# Patient Record
Sex: Female | Born: 1975
Health system: Southern US, Community
[De-identification: ages and names within clinical notes are randomized; demographics above are authoritative.]

## PROBLEM LIST (undated history)

## (undated) DIAGNOSIS — N926 Irregular menstruation, unspecified: Secondary | ICD-10-CM

## (undated) DIAGNOSIS — D649 Anemia, unspecified: Secondary | ICD-10-CM

## (undated) DIAGNOSIS — T7840XA Allergy, unspecified, initial encounter: Secondary | ICD-10-CM

## (undated) DIAGNOSIS — Z87440 Personal history of urinary (tract) infections: Secondary | ICD-10-CM

## (undated) DIAGNOSIS — N63 Unspecified lump in unspecified breast: Secondary | ICD-10-CM

## (undated) DIAGNOSIS — I10 Essential (primary) hypertension: Secondary | ICD-10-CM

## (undated) DIAGNOSIS — N939 Abnormal uterine and vaginal bleeding, unspecified: Secondary | ICD-10-CM

## (undated) HISTORY — DX: Anemia, unspecified: D64.9

## (undated) HISTORY — PX: ABDOMINAL HYSTERECTOMY: SHX81

## (undated) HISTORY — DX: Allergy, unspecified, initial encounter: T78.40XA

## (undated) HISTORY — PX: DILATION AND CURETTAGE OF UTERUS: SHX78

## (undated) HISTORY — DX: Personal history of urinary (tract) infections: Z87.440

## (undated) HISTORY — DX: Essential (primary) hypertension: I10

---

## 2007-07-12 ENCOUNTER — Emergency Department: Payer: Self-pay | Admitting: Emergency Medicine

## 2007-07-14 ENCOUNTER — Emergency Department: Payer: Self-pay | Admitting: Emergency Medicine

## 2010-07-29 ENCOUNTER — Ambulatory Visit: Payer: Self-pay | Admitting: Emergency Medicine

## 2013-08-16 ENCOUNTER — Ambulatory Visit: Payer: Self-pay | Admitting: Obstetrics and Gynecology

## 2013-08-16 LAB — CBC
HCT: 24.9 % — ABNORMAL LOW (ref 35.0–47.0)
HGB: 8.1 g/dL — ABNORMAL LOW (ref 12.0–16.0)
MCH: 28.2 pg (ref 26.0–34.0)
MCHC: 32.8 g/dL (ref 32.0–36.0)
MCV: 86 fL (ref 80–100)
Platelet: 449 10*3/uL — ABNORMAL HIGH (ref 150–440)
RBC: 2.88 10*6/uL — ABNORMAL LOW (ref 3.80–5.20)
RDW: 17.4 % — ABNORMAL HIGH (ref 11.5–14.5)

## 2013-08-16 LAB — BASIC METABOLIC PANEL
BUN: 12 mg/dL (ref 7–18)
Chloride: 107 mmol/L (ref 98–107)
Co2: 29 mmol/L (ref 21–32)
Creatinine: 0.71 mg/dL (ref 0.60–1.30)
Osmolality: 276 (ref 275–301)
Potassium: 4 mmol/L (ref 3.5–5.1)
Sodium: 139 mmol/L (ref 136–145)

## 2013-08-16 LAB — URINALYSIS, COMPLETE
Ketone: NEGATIVE
Ph: 7 (ref 4.5–8.0)
RBC,UR: 28 /HPF (ref 0–5)
Squamous Epithelial: 1

## 2013-08-20 ENCOUNTER — Ambulatory Visit: Payer: Self-pay | Admitting: Obstetrics and Gynecology

## 2014-12-23 ENCOUNTER — Ambulatory Visit: Payer: Self-pay | Admitting: Obstetrics and Gynecology

## 2015-03-21 NOTE — Op Note (Signed)
PATIENT NAME:  Natalie Fisher, Natalie Fisher MR#:  [id number] DATE OF BIRTH:  [birth year]  DATE OF PROCEDURE:  08/20/2013  PREOPERATIVE DIAGNOSES: Polymenorrhea and menorrhagia.   POSTOPERATIVE DIAGNOSIS: Polymenorrhea and menorrhagia.   PROCEDURE: Dilatation and curettage hysteroscopy.   SURGEON: Ricky L. Mitzy Naron.  ANESTHESIA: General orotracheal.  FINDINGS: Morbidly obese female, easily dilated cervix with hysteroscopic findings showing fluffy endometrium without evidence of distinct anatomical structures.   ESTIMATED BLOOD LOSS: Minimal.   COMPLICATIONS: None.   SPECIMENS: Endometrial curetting.   DRAINS: Red rubber at the end of the case, possibly 100 mL.   PROCEDURE IN DETAIL: The patient was consented. Preoperative antibiotics were given. The patient was taken to the operating room and placed in the supine position where anesthesia was initiated and she was then prepped and draped in the usual sterile fashion after being placed in the dorsal lithotomy position. The cervix was visualized and grasped with a single-tooth tenaculum, easily dilated _____ hysteroscope with findings as noted above. Sharp curettage was carried out with a moderate amount of curettings returned, which were sent for permanent section. Instruments removed. Cervix was made hemostatic with silver nitrate. The patient tolerated the procedure well and was returned to supine position.   I anticipate a routine postoperative course.    ____________________________ Rockey Situ. Amalia Hailey, MD rle:sg D: 08/20/2013 14:07:32 ET T: 08/20/2013 14:34:41 ET JOB#: [id number]  cc: Ricky L. Amalia Hailey, MD, <Dictator> Selmer Dominion MD ELECTRONICALLY SIGNED 08/21/2013 10:48

## 2016-11-04 ENCOUNTER — Other Ambulatory Visit: Payer: Self-pay | Admitting: Obstetrics and Gynecology

## 2016-11-04 DIAGNOSIS — N6310 Unspecified lump in the right breast, unspecified quadrant: Secondary | ICD-10-CM

## 2016-11-04 DIAGNOSIS — N6320 Unspecified lump in the left breast, unspecified quadrant: Principal | ICD-10-CM

## 2016-11-11 ENCOUNTER — Other Ambulatory Visit: Payer: Self-pay | Admitting: Obstetrics and Gynecology

## 2016-11-11 DIAGNOSIS — N63 Unspecified lump in unspecified breast: Secondary | ICD-10-CM

## 2016-12-23 ENCOUNTER — Ambulatory Visit
Admission: RE | Admit: 2016-12-23 | Discharge: 2016-12-23 | Disposition: A | Discharge status: Home / Self Care | Payer: 59 | Source: Ambulatory Visit | Attending: Obstetrics and Gynecology | Admitting: Obstetrics and Gynecology

## 2016-12-23 DIAGNOSIS — N6310 Unspecified lump in the right breast, unspecified quadrant: Secondary | ICD-10-CM | POA: Diagnosis not present

## 2016-12-23 DIAGNOSIS — N63 Unspecified lump in unspecified breast: Secondary | ICD-10-CM

## 2016-12-23 DIAGNOSIS — N6489 Other specified disorders of breast: Secondary | ICD-10-CM | POA: Diagnosis not present

## 2016-12-23 DIAGNOSIS — R928 Other abnormal and inconclusive findings on diagnostic imaging of breast: Secondary | ICD-10-CM | POA: Diagnosis not present

## 2016-12-23 DIAGNOSIS — N6320 Unspecified lump in the left breast, unspecified quadrant: Secondary | ICD-10-CM | POA: Diagnosis not present

## 2016-12-23 HISTORY — DX: Unspecified lump in unspecified breast: N63.0

## 2017-06-21 DIAGNOSIS — N921 Excessive and frequent menstruation with irregular cycle: Secondary | ICD-10-CM | POA: Diagnosis not present

## 2017-07-04 DIAGNOSIS — N920 Excessive and frequent menstruation with regular cycle: Secondary | ICD-10-CM | POA: Diagnosis not present

## 2017-07-04 DIAGNOSIS — N921 Excessive and frequent menstruation with irregular cycle: Secondary | ICD-10-CM | POA: Diagnosis not present

## 2017-07-19 DIAGNOSIS — N920 Excessive and frequent menstruation with regular cycle: Secondary | ICD-10-CM | POA: Diagnosis not present

## 2017-07-19 DIAGNOSIS — R600 Localized edema: Secondary | ICD-10-CM | POA: Diagnosis not present

## 2017-07-19 DIAGNOSIS — I10 Essential (primary) hypertension: Secondary | ICD-10-CM | POA: Diagnosis not present

## 2017-08-25 DIAGNOSIS — Z862 Personal history of diseases of the blood and blood-forming organs and certain disorders involving the immune mechanism: Secondary | ICD-10-CM | POA: Diagnosis not present

## 2017-08-25 DIAGNOSIS — N920 Excessive and frequent menstruation with regular cycle: Secondary | ICD-10-CM | POA: Diagnosis not present

## 2017-08-31 NOTE — H&P (Signed)
Patient ID: Natalie Fisher is a 41 y.o. female presenting with Pre Op Consulting  on 08/25/2017  HPI: She is bleeding, menometrorrhagia and significant and sx anemia. Irregular periods since age 74. G0, but considering childbearing and doesn't really want kids.  TSH wnl H/H 8.1/28.5 and elevated ptls 607, likely due to her anemia  She has had a workup in the past. Prior studies: ultrasound and labs .  Ultrasound: abnormal  Fibroids: fundal fibroid 6.89cm   Previous treatment(s): hormonal including hormonal contraceptives. She had a D&C with Dr. Amalia Hailey in 2015  Health care maintenance:      Last Pap smear:  12/17- neg with  Neg HPV  Contraception: OCP (estrogen/progesterone)  EMBx: collected today  HTN: poorly controlled Consider blood transfusion prior to surgery/iron infusions  Past Medical History:  has a past medical history of Depression, unspecified; Fibroid; HSV-2 infection; Hypertension; IDA (iron deficiency anemia); and Vitamin B12 deficiency.  Past Surgical History:  has a past surgical history that includes Dilation and curettage, diagnostic / therapeutic. Family History: family history includes Colon cancer in her other; Diabetes in her father; Heart disease in her father, maternal grandmother, paternal grandmother, and paternal uncle; High blood pressure (Hypertension) in her father and mother; Ovarian cancer in her paternal grandmother. Social History:  reports that she quit smoking about 15 months ago. Her smoking use included Cigarettes. She smoked 0.00 packs per day. She has never used smokeless tobacco. She reports that she drinks alcohol. She reports that she does not use drugs. OB/GYN History:          OB History    Gravida Para Term Preterm AB Living   0 0 0 0 0 0   SAB TAB Ectopic Molar Multiple Live Births   0 0 0   0        Allergies: has No Known Allergies. Medications:  Current Outpatient Prescriptions:  .  amLODIPine  (NORVASC) 5 MG tablet, Take 1 tablet (5 mg total) by mouth once daily., Disp: 30 tablet, Rfl: 2 .  cyanocobalamin (VITAMIN B12) 1,000 mcg/mL injection, Inject 1,000 mcg into the muscle monthly., Disp: , Rfl:  .  ferrous sulfate 325 (65 FE) MG tablet, Take 325 mg by mouth daily with breakfast., Disp: , Rfl:  .  hydroCHLOROthiazide (HYDRODIURIL) 25 MG tablet, Take 1 tablet (25 mg total) by mouth once daily., Disp: 30 tablet, Rfl: 2 .  norethindrone-ethinyl estradiol (OVCON-35) 0.4-35 mg-mcg tablet, Take 1 tablet by mouth once daily., Disp: 3 Package, Rfl: 3   Review of Systems: No SOB, no palpitations or chest pain, no new lower extremity edema, no nausea or vomiting or bowel or bladder complaints. See HPI for gyn specific ROS.   Exam:   BP (!) 142/94   Ht 166.4 cm (5' 5.5")   Wt (!) 148.7 kg (327 lb 12.8 oz)   LMP 07/31/2017 (Approximate)   BMI 53.72 kg/m   General: Patient is well-groomed, well-nourished, appears stated age in no acute distress  HEENT: head is atraumatic and normocephalic, trachea is midline, neck is supple with no palpable nodules  CV: Regular rhythm and normal heart rate, no murmur  Pulm: Clear to auscultation throughout lung fields with no wheezing, crackles, or rhonchi. No increased work of breathing  Abdomen: soft , no mass, non-tender, no rebound tenderness, no hepatomegaly  Pelvic: tanner stage 5 ,              External genitalia: vulva /labia no lesions  Urethra: no prolapse             Vagina: normal physiologic d/c, laxity in vaginal walls             Cervix: no lesions, no cervical motion tenderness, no descent             Uterus: normal size shape and contour, non-tender             Adnexa: no mass,  non-tender               Rectovaginal: External wnl   Impression:   The primary encounter diagnosis was Excessive or frequent menstruation. A diagnosis of History of anemia was also pertinent to this visit.    Plan:     Patient returns for a preoperative discussion regarding her plans to proceed with surgical treatment of her AUB by supracervical laparoscopic hysterectomy with bilateral salpingectomy  procedure. We will perform a cystoscopy to evaluate the urinary tract after the procedure.   The patient and I discussed the technical aspects of the procedure including the potential for risks and complications. These include but are not limited to the risk of infection requiring post-operative antibiotics or further procedures. We talked about the risk of injury to adjacent organs including bladder, bowel, ureter, blood vessels or nerves. We talked about the need to convert to an open incision. We talked about the possible need for blood transfusion. We talked aboutpostop complications such asthromboembolic or cardiopulmonary complications. All of her questions were answered.  Her preoperative exam was completed and the appropriate consents were signed. She is scheduled to undergo this procedure in the near future.  Specific Peri-operative Considerations:  - Consent: obtained today  - Labs: CBC, CMP preoperatively - Studies: EKG, CXR preoperatively - Bowel Preparation: None required - Abx:  Cefoxitin 3g - VTE ppx: SCDs perioperatively - Glucose Protocol: -type 1 diabetes   - Hx of Jehovah's Witness, but accepts blood to save life. - Hx of Type 1 diabetes with pump - Desires to keep cervix: Plan for eXcite technique with 49mm bag and Endoscope closure - Hx of anemia: Recommend Iron infusions if needed - f/u on embx taken today - obesity: BMI 54

## 2017-09-02 ENCOUNTER — Inpatient Hospital Stay: Payer: 59

## 2017-09-02 ENCOUNTER — Encounter: Payer: Self-pay | Admitting: Oncology

## 2017-09-02 ENCOUNTER — Inpatient Hospital Stay: Payer: 59 | Attending: Oncology | Admitting: Oncology

## 2017-09-02 VITALS — BP 157/85 | HR 84 | Temp 96.6°F | Resp 16 | Ht 66.75 in | Wt 328.5 lb

## 2017-09-02 DIAGNOSIS — D538 Other specified nutritional anemias: Secondary | ICD-10-CM

## 2017-09-02 DIAGNOSIS — D500 Iron deficiency anemia secondary to blood loss (chronic): Secondary | ICD-10-CM | POA: Diagnosis present

## 2017-09-02 DIAGNOSIS — N920 Excessive and frequent menstruation with regular cycle: Secondary | ICD-10-CM | POA: Insufficient documentation

## 2017-09-02 DIAGNOSIS — D649 Anemia, unspecified: Secondary | ICD-10-CM

## 2017-09-02 DIAGNOSIS — E538 Deficiency of other specified B group vitamins: Secondary | ICD-10-CM | POA: Diagnosis not present

## 2017-09-02 DIAGNOSIS — Z87440 Personal history of urinary (tract) infections: Secondary | ICD-10-CM | POA: Diagnosis not present

## 2017-09-02 DIAGNOSIS — I10 Essential (primary) hypertension: Secondary | ICD-10-CM | POA: Insufficient documentation

## 2017-09-02 DIAGNOSIS — N63 Unspecified lump in unspecified breast: Secondary | ICD-10-CM | POA: Diagnosis not present

## 2017-09-02 DIAGNOSIS — Z79899 Other long term (current) drug therapy: Secondary | ICD-10-CM | POA: Insufficient documentation

## 2017-09-02 DIAGNOSIS — F17210 Nicotine dependence, cigarettes, uncomplicated: Secondary | ICD-10-CM | POA: Insufficient documentation

## 2017-09-02 DIAGNOSIS — Z720 Tobacco use: Secondary | ICD-10-CM

## 2017-09-02 LAB — IRON AND TIBC
IRON: 90 ug/dL (ref 28–170)
Saturation Ratios: 19 % (ref 10.4–31.8)
TIBC: 484 ug/dL — ABNORMAL HIGH (ref 250–450)
UIBC: 394 ug/dL

## 2017-09-02 LAB — CBC WITH DIFFERENTIAL/PLATELET
Basophils Absolute: 0.1 10*3/uL (ref 0–0.1)
Basophils Relative: 1 %
Eosinophils Absolute: 0.1 10*3/uL (ref 0–0.7)
Eosinophils Relative: 1 %
HCT: 35 % (ref 35.0–47.0)
Hemoglobin: 11.5 g/dL — ABNORMAL LOW (ref 12.0–16.0)
Lymphocytes Relative: 24 %
Lymphs Abs: 2.4 10*3/uL (ref 1.0–3.6)
MCH: 26.5 pg (ref 26.0–34.0)
MCHC: 32.9 g/dL (ref 32.0–36.0)
MCV: 80.6 fL (ref 80.0–100.0)
Monocytes Absolute: 0.6 10*3/uL (ref 0.2–0.9)
Monocytes Relative: 6 %
Neutro Abs: 6.9 10*3/uL — ABNORMAL HIGH (ref 1.4–6.5)
Neutrophils Relative %: 68 %
Platelets: 425 10*3/uL (ref 150–440)
RBC: 4.34 MIL/uL (ref 3.80–5.20)
RDW: 27.6 % — ABNORMAL HIGH (ref 11.5–14.5)
WBC: 10.1 10*3/uL (ref 3.6–11.0)

## 2017-09-02 LAB — COMPREHENSIVE METABOLIC PANEL
ALBUMIN: 3.5 g/dL (ref 3.5–5.0)
ALT: 11 U/L — ABNORMAL LOW (ref 14–54)
AST: 15 U/L (ref 15–41)
Alkaline Phosphatase: 73 U/L (ref 38–126)
Anion gap: 7 (ref 5–15)
BILIRUBIN TOTAL: 0.3 mg/dL (ref 0.3–1.2)
BUN: 11 mg/dL (ref 6–20)
CO2: 24 mmol/L (ref 22–32)
Calcium: 9.2 mg/dL (ref 8.9–10.3)
Chloride: 105 mmol/L (ref 101–111)
Creatinine, Ser: 0.55 mg/dL (ref 0.44–1.00)
GFR calc Af Amer: >60 mL/min (ref >60)
GLUCOSE: 74 mg/dL (ref 65–99)
POTASSIUM: 4 mmol/L (ref 3.5–5.1)
SODIUM: 136 mmol/L (ref 135–145)
Total Protein: 7.6 g/dL (ref 6.5–8.1)

## 2017-09-02 LAB — FOLATE: FOLATE: 59.2 ng/mL (ref >5.9)

## 2017-09-02 LAB — VITAMIN B12: Vitamin B-12: 271 pg/mL (ref 180–914)

## 2017-09-02 LAB — FERRITIN: FERRITIN: 14 ng/mL (ref 11–307)

## 2017-09-02 NOTE — Progress Notes (Signed)
Patient here today as a new patient for anemia.  Patient is scheduled for hysterectomy next week

## 2017-09-02 NOTE — Progress Notes (Signed)
Hematology/Oncology Consult note [facility] [phone] [phone]  CONSULT NOTE Patient Care Team: Patient, No Pcp Per as PCP - General (General Practice)  REFERRING PROVIDER: [place]:  Evaluation and management of anemia, optimizing anemia before surgery.   HISTORY OF PRESENTING ILLNESS:   41 y.o.  female with PMH listed below who was referred by Lonn Georgia to me for evaluation of anemia. Patient reports history iron deficiency anemia due to heavy menstrual period/blood loss. Records from [facility] was reviewed.   She has Gyn work up and found to have fundal fibroid. Previously she has had hormonal treatment including contraceptives. Had D&C in 2015. Last pap smear was 10/2016, HPV was negative. She also has a history of B12 deficiency.  Patient has been taking oral iron supplementation and B12 supplementation. She is single,does not have children. She decides to pursue hysterotomy which is schedule next week and her GYN referred her to me for evaluation of anemia and possible IV iron treatment. She is Fara Boros witness and does not want to blood transfusion.  Other medical history include morbid obesity and hypertension. She feels tired and craves for ice chips.   ROS:  Review of Systems  Constitutional: Positive for fatigue.  HENT:  Negative.   Eyes: Negative.   Respiratory: Positive for shortness of breath.   Cardiovascular: Negative.   Gastrointestinal: Negative.   Endocrine: Negative.   Genitourinary: Negative.    Musculoskeletal: Negative.   Neurological: Negative.   Hematological: Negative.   Psychiatric/Behavioral: Negative.     MEDICAL HISTORY:  Past Medical History:  Diagnosis Date  . Allergy   . Anemia   . Breast mass    Left Breast 9 oclock / Right Breast 3oclock  . History of bladder infections   . Hypertension     SURGICAL  HISTORY: History reviewed. No pertinent surgical history.  SOCIAL HISTORY: Social History   Social History  . Marital status: Single    Spouse name: N/A  . Number of children: N/A  . Years of education: N/A   Occupational History  . Not on file.   Social History Main Topics  . Smoking status: Light Tobacco Smoker    Packs/day: 0.25    Years: 20.00    Types: Cigars  . Smokeless tobacco: Never Used  . Alcohol use Yes     Comment: twice mthly  . Drug use: No  . Sexual activity: Yes   Other Topics Concern  . Not on file   Social History Narrative  . No narrative on file    FAMILY HISTORY: Family History  Problem Relation Age of Onset  . Anemia Mother   . Diabetes Father   . Heart disease Father   . Stroke Father   . Heart disease Maternal Grandmother   . Ovarian cancer Paternal Grandmother   . Diabetes Paternal Grandmother   . Heart disease Paternal Grandmother   . Breast cancer Neg Hx     ALLERGIES:  has No Known Allergies.  MEDICATIONS:  Current Outpatient Prescriptions  Medication Sig Dispense Refill  . amLODipine (NORVASC) 5 MG tablet Take 5 mg by mouth daily.    . Cyanocobalamin (VITAMIN B-12) 500 MCG LOZG Take 500 mcg by mouth daily.    . ferrous sulfate 325 (65 FE) MG tablet Take 325 mg by mouth 2 (two) times daily with a meal.    . hydrochlorothiazide (HYDRODIURIL) 25 MG tablet Take 25 mg by mouth daily.    Marland Kitchen  norethindrone-ethinyl estradiol (BALZIVA) 0.4-35 MG-MCG tablet Take 1 tablet by mouth daily.     No current facility-administered medications for this visit.       Marland Kitchen  PHYSICAL EXAMINATION: ECOG PERFORMANCE STATUS: 1 - Symptomatic but completely ambulatory Vitals:   09/02/17 1039  BP: (!) 157/85  Pulse: 84  Resp: 16  Temp: (!) 96.6 F (35.9 C)   Filed Weights   09/02/17 1039  Weight: (!) 328 lb 8 oz (149 kg)    GENERAL:Alert, no distress and comfortable.  EYES: no pallor or icterus OROPHARYNX: no thrush or ulceration;  NECK:  supple, no masses felt LYMPH:  no palpable lymphadenopathy in the cervical, axillary or inguinal regions LUNGS: clear to auscultation and  No wheeze or crackles HEART/CVS: regular rate & rhythm and no murmurs; No lower extremity edema ABDOMEN: abdomen soft, non-tender and normal bowel sounds Musculoskeletal:no cyanosis of digits and no clubbing  PSYCH: alert & oriented x 3  NEURO: no focal motor/sensory deficits SKIN:  no rashes or significant lesions  LABORATORY DATA:  I have reviewed the data as listed Recent labs from [place] was reviewed.   No results for input(s): NA, K, CL, CO2, GLUCOSE, BUN, CREATININE, CALCIUM, GFRNONAA, GFRAA, PROT, ALBUMIN, AST, ALT, ALKPHOS, BILITOT, BILIDIR, IBILI in the last 8760 hours.  RADIOGRAPHIC STUDIES: I have personally reviewed the radiological images as listed and agreed with the findings in the report.   ASSESSMENT & PLAN:  1. Normocytic anemia   2. Iron deficiency anemia due to chronic blood loss   3. Tobacco abuse    Will check cbc, cmp, iron/tibc and ferritin. Check b12 and folate.  If her iron store is deplete, plan IV FeraHeme. Plan IV iron with FeraHeme 510mg  x 2 doses. Allergy reactions/infusion reaction including anaphylactic reaction discussed with patient. Patient voices understanding and willing to proceed. Smoke cessation discussed with patient. Information of smoke cessation program was provided to patient.   All questions were answered. The patient knows to call the clinic with any problems questions or concerns. Return of visit: 09/29/2017 with cbc, ferritin, iron TIBC done 1 day prior to visit Thank you for this kind referral and the opportunity to participate in the care of this patient. A copy of today's note is routed to referring provider Dr.Beasley.    Earlie Server, MD, PhD Hematology Oncology [facility] Pager- [phone] 09/02/2017

## 2017-09-05 ENCOUNTER — Encounter
Admission: RE | Admit: 2017-09-05 | Discharge: 2017-09-05 | Disposition: A | Discharge status: Home / Self Care | Payer: 59 | Source: Ambulatory Visit | Attending: Obstetrics and Gynecology | Admitting: Obstetrics and Gynecology

## 2017-09-05 HISTORY — DX: Abnormal uterine and vaginal bleeding, unspecified: N93.9

## 2017-09-05 HISTORY — DX: Irregular menstruation, unspecified: N92.6

## 2017-09-05 NOTE — Patient Instructions (Signed)
Your procedure is scheduled on: 09/12/17 Mon Report to Same Day Surgery 2nd floor medical mall [facility] Entrance-take elevator on left to 2nd floor.  Check in with surgery information desk.) To find out your arrival time please call [phone] between 1PM - 3PM on 09/09/17 Fri  Remember: Instructions that are not followed completely may result in serious medical risk, up to and including death, or upon the discretion of your surgeon and anesthesiologist your surgery may need to be rescheduled.    _x___ 1. Do not eat food after midnight the night before your procedure. You may drink clear liquids up to 2 hours before you are scheduled to arrive at the hospital for your procedure.  Do not drink clear liquids within 2 hours of your scheduled arrival to the hospital.  Clear liquids include  --Water or Apple juice without pulp  --Clear carbohydrate beverage such as ClearFast or Gatorade  --Black Coffee or Clear Tea (No milk, no creamers, do not add anything to                  the coffee or Tea Type 1 and type 2 diabetics should only drink water.  No gum chewing or hard candies.     __x__ 2. No Alcohol for 24 hours before or after surgery.   __x__3. No Smoking for 24 prior to surgery.   ____  4. Bring all medications with you on the day of surgery if instructed.    __x__ 5. Notify your doctor if there is any change in your medical condition     (cold, fever, infections).     Do not wear jewelry, make-up, hairpins, clips or nail polish.  Do not wear lotions, powders, or perfumes. You may wear deodorant.  Do not shave 48 hours prior to surgery. Men may shave face and neck.  Do not bring valuables to the hospital.    [facility] is not responsible for any belongings or valuables.               Contacts, dentures or bridgework may not be worn into surgery.  Leave your suitcase in the car. After surgery it may be brought to your room.  For patients admitted to the hospital, discharge  time is determined by your                       treatment team.   Patients discharged the day of surgery will not be allowed to drive home.  You will need someone to drive you home and stay with you the night of your procedure.    Please read over the following fact sheets that you were given:   [facility] Preparing for Surgery and or MRSA Information   _x___ Take anti-hypertensive listed below, cardiac, seizure, asthma,     anti-reflux and psychiatric medicines. These include:  1. amLODipine (NORVASC) 5 MG tablet  2.  3.  4.  5.  6.  ____Fleets enema or Magnesium Citrate as directed.   _x___ Use CHG Soap or sage wipes as directed on instruction sheet   ____ Use inhalers on the day of surgery and bring to hospital day of surgery  ____ Stop Metformin and Janumet 2 days prior to surgery.    ____ Take 1/2 of usual insulin dose the night before surgery and none on the morning     surgery.   _x___ Follow recommendations from Cardiologist, Pulmonologist or PCP regarding  stopping Aspirin, Coumadin, Plavix ,Eliquis, Effient, or Pradaxa, and Pletal.  X____Stop Anti-inflammatories such as Advil, Aleve, Ibuprofen, Motrin, Naproxen, Naprosyn, Goodies powders or aspirin products. OK to take Tylenol and                          Celebrex.   _x___ Stop supplements until after surgery.  But may continue Vitamin D, Vitamin B,       and multivitamin.   ____ Bring C-Pap to the hospital.

## 2017-09-07 ENCOUNTER — Ambulatory Visit
Admission: RE | Admit: 2017-09-07 | Discharge: 2017-09-07 | Disposition: A | Discharge status: Home / Self Care | Payer: 59 | Source: Ambulatory Visit | Attending: Obstetrics and Gynecology | Admitting: Obstetrics and Gynecology

## 2017-09-07 ENCOUNTER — Inpatient Hospital Stay: Payer: 59

## 2017-09-07 VITALS — BP 154/89 | HR 84 | Resp 18

## 2017-09-07 DIAGNOSIS — D649 Anemia, unspecified: Secondary | ICD-10-CM | POA: Diagnosis not present

## 2017-09-07 DIAGNOSIS — D500 Iron deficiency anemia secondary to blood loss (chronic): Secondary | ICD-10-CM

## 2017-09-07 DIAGNOSIS — Z01812 Encounter for preprocedural laboratory examination: Secondary | ICD-10-CM | POA: Insufficient documentation

## 2017-09-07 DIAGNOSIS — Z01810 Encounter for preprocedural cardiovascular examination: Secondary | ICD-10-CM | POA: Insufficient documentation

## 2017-09-07 DIAGNOSIS — E669 Obesity, unspecified: Secondary | ICD-10-CM | POA: Insufficient documentation

## 2017-09-07 LAB — TYPE AND SCREEN
ABO/RH(D): O POS
Antibody Screen: NEGATIVE

## 2017-09-07 LAB — PROTIME-INR
INR: 0.97
PROTHROMBIN TIME: 12.8 s (ref 11.4–15.2)

## 2017-09-07 MED ORDER — SODIUM CHLORIDE 0.9 % IV SOLN
510.0000 mg | Freq: Once | INTRAVENOUS | Status: AC
  Administered 2017-09-07: 510 mg via INTRAVENOUS
  Filled 2017-09-07: qty 17

## 2017-09-07 MED ORDER — SODIUM CHLORIDE 0.9 % IV SOLN
Freq: Once | INTRAVENOUS | Status: AC
  Administered 2017-09-07: 14:00:00 via INTRAVENOUS
  Filled 2017-09-07: qty 1000

## 2017-09-12 ENCOUNTER — Ambulatory Visit
Admission: RE | Admit: 2017-09-12 | Discharge: 2017-09-12 | Disposition: A | Discharge status: Home / Self Care | Payer: Commercial Managed Care - HMO | Source: Ambulatory Visit | Attending: Obstetrics and Gynecology | Admitting: Obstetrics and Gynecology

## 2017-09-12 ENCOUNTER — Encounter
Admission: RE | Disposition: A | Discharge status: Home / Self Care | Payer: Self-pay | Source: Ambulatory Visit | Attending: Obstetrics and Gynecology

## 2017-09-12 ENCOUNTER — Encounter: Payer: Self-pay | Admitting: Emergency Medicine

## 2017-09-12 ENCOUNTER — Ambulatory Visit: Payer: Commercial Managed Care - HMO | Admitting: Certified Registered"

## 2017-09-12 DIAGNOSIS — Z6841 Body Mass Index (BMI) 40.0 and over, adult: Secondary | ICD-10-CM | POA: Diagnosis not present

## 2017-09-12 DIAGNOSIS — Z87891 Personal history of nicotine dependence: Secondary | ICD-10-CM | POA: Insufficient documentation

## 2017-09-12 DIAGNOSIS — F329 Major depressive disorder, single episode, unspecified: Secondary | ICD-10-CM | POA: Insufficient documentation

## 2017-09-12 DIAGNOSIS — N921 Excessive and frequent menstruation with irregular cycle: Secondary | ICD-10-CM | POA: Diagnosis not present

## 2017-09-12 DIAGNOSIS — I10 Essential (primary) hypertension: Secondary | ICD-10-CM | POA: Diagnosis not present

## 2017-09-12 DIAGNOSIS — N920 Excessive and frequent menstruation with regular cycle: Secondary | ICD-10-CM | POA: Insufficient documentation

## 2017-09-12 DIAGNOSIS — Z79899 Other long term (current) drug therapy: Secondary | ICD-10-CM | POA: Insufficient documentation

## 2017-09-12 DIAGNOSIS — D649 Anemia, unspecified: Secondary | ICD-10-CM | POA: Diagnosis not present

## 2017-09-12 DIAGNOSIS — D509 Iron deficiency anemia, unspecified: Secondary | ICD-10-CM | POA: Diagnosis not present

## 2017-09-12 DIAGNOSIS — D259 Leiomyoma of uterus, unspecified: Secondary | ICD-10-CM | POA: Insufficient documentation

## 2017-09-12 HISTORY — PX: LAPAROSCOPIC SUPRACERVICAL HYSTERECTOMY: SHX5399

## 2017-09-12 HISTORY — PX: LAPAROSCOPIC BILATERAL SALPINGECTOMY: SHX5889

## 2017-09-12 LAB — POCT PREGNANCY, URINE: PREG TEST UR: NEGATIVE

## 2017-09-12 LAB — ABO/RH: ABO/RH(D): O POS

## 2017-09-12 SURGERY — HYSTERECTOMY, SUPRACERVICAL, LAPAROSCOPIC
Anesthesia: General

## 2017-09-12 MED ORDER — GABAPENTIN 300 MG PO CAPS
900.0000 mg | ORAL_CAPSULE | ORAL | Status: AC
  Administered 2017-09-12: 900 mg via ORAL

## 2017-09-12 MED ORDER — IODINE STRONG (LUGOLS) 5 % PO SOLN
ORAL | Status: AC
  Filled 2017-09-12: qty 1

## 2017-09-12 MED ORDER — ACETAMINOPHEN 500 MG PO TABS
1000.0000 mg | ORAL_TABLET | ORAL | Status: AC
  Administered 2017-09-12: 1000 mg via ORAL

## 2017-09-12 MED ORDER — ONDANSETRON HCL 4 MG/2ML IJ SOLN
INTRAMUSCULAR | Status: AC
  Filled 2017-09-12: qty 2

## 2017-09-12 MED ORDER — ACETAMINOPHEN 500 MG PO TABS
ORAL_TABLET | ORAL | Status: AC
  Filled 2017-09-12: qty 2

## 2017-09-12 MED ORDER — SUCCINYLCHOLINE CHLORIDE 20 MG/ML IJ SOLN
INTRAMUSCULAR | Status: DC | PRN
  Administered 2017-09-12: 100 mg via INTRAVENOUS

## 2017-09-12 MED ORDER — CELECOXIB 200 MG PO CAPS
400.0000 mg | ORAL_CAPSULE | ORAL | Status: AC
  Administered 2017-09-12: 400 mg via ORAL

## 2017-09-12 MED ORDER — ONDANSETRON HCL 4 MG/2ML IJ SOLN
INTRAMUSCULAR | Status: DC | PRN
  Administered 2017-09-12 (×2): 4 mg via INTRAVENOUS

## 2017-09-12 MED ORDER — ESMOLOL HCL 100 MG/10ML IV SOLN
INTRAVENOUS | Status: AC
  Filled 2017-09-12: qty 10

## 2017-09-12 MED ORDER — ROCURONIUM BROMIDE 100 MG/10ML IV SOLN
INTRAVENOUS | Status: DC | PRN
  Administered 2017-09-12: 10 mg via INTRAVENOUS
  Administered 2017-09-12: 40 mg via INTRAVENOUS
  Administered 2017-09-12: 20 mg via INTRAVENOUS

## 2017-09-12 MED ORDER — FENTANYL CITRATE (PF) 100 MCG/2ML IJ SOLN
INTRAMUSCULAR | Status: DC | PRN
  Administered 2017-09-12 (×5): 50 ug via INTRAVENOUS

## 2017-09-12 MED ORDER — OXYCODONE HCL 5 MG PO CAPS: 5.0000 mg | ORAL_CAPSULE | Freq: Four times a day (QID) | ORAL | 0 refills | Status: DC | PRN

## 2017-09-12 MED ORDER — ONDANSETRON HCL 4 MG/2ML IJ SOLN
4.0000 mg | Freq: Once | INTRAMUSCULAR | Status: AC | PRN
  Administered 2017-09-12: 4 mg via INTRAVENOUS

## 2017-09-12 MED ORDER — LIDOCAINE HCL (CARDIAC) 20 MG/ML IV SOLN
INTRAVENOUS | Status: DC | PRN
  Administered 2017-09-12: 40 mg via INTRAVENOUS

## 2017-09-12 MED ORDER — PROPOFOL 10 MG/ML IV BOLUS
INTRAVENOUS | Status: DC | PRN
Start: 2017-09-12 — End: 2017-09-12
  Administered 2017-09-12: 200 mg via INTRAVENOUS

## 2017-09-12 MED ORDER — CEFAZOLIN SODIUM-DEXTROSE 2-4 GM/100ML-% IV SOLN
INTRAVENOUS | Status: AC
  Filled 2017-09-12: qty 100

## 2017-09-12 MED ORDER — DEXAMETHASONE SODIUM PHOSPHATE 10 MG/ML IJ SOLN
INTRAMUSCULAR | Status: DC | PRN
  Administered 2017-09-12: 10 mg via INTRAVENOUS

## 2017-09-12 MED ORDER — FAMOTIDINE 20 MG PO TABS
ORAL_TABLET | ORAL | Status: AC
  Administered 2017-09-12: 20 mg via ORAL
  Filled 2017-09-12: qty 1

## 2017-09-12 MED ORDER — FENTANYL CITRATE (PF) 100 MCG/2ML IJ SOLN
25.0000 ug | INTRAMUSCULAR | Status: DC | PRN
  Administered 2017-09-12 (×3): 25 ug via INTRAVENOUS

## 2017-09-12 MED ORDER — LABETALOL HCL 5 MG/ML IV SOLN
INTRAVENOUS | Status: DC | PRN
  Administered 2017-09-12 (×2): 5 mg via INTRAVENOUS

## 2017-09-12 MED ORDER — LACTATED RINGERS IV SOLN
INTRAVENOUS | Status: DC
  Administered 2017-09-12 (×2): via INTRAVENOUS

## 2017-09-12 MED ORDER — ESMOLOL HCL 100 MG/10ML IV SOLN
INTRAVENOUS | Status: DC | PRN
  Administered 2017-09-12: 30 mg via INTRAVENOUS

## 2017-09-12 MED ORDER — BUPIVACAINE HCL 0.5 % IJ SOLN
INTRAMUSCULAR | Status: DC | PRN
  Administered 2017-09-12: 16 mL

## 2017-09-12 MED ORDER — MIDAZOLAM HCL 2 MG/2ML IJ SOLN
INTRAMUSCULAR | Status: AC
  Filled 2017-09-12: qty 2

## 2017-09-12 MED ORDER — MIDAZOLAM HCL 2 MG/2ML IJ SOLN
INTRAMUSCULAR | Status: DC | PRN
  Administered 2017-09-12: 2 mg via INTRAVENOUS

## 2017-09-12 MED ORDER — ACETAMINOPHEN 500 MG PO TABS: 1000.0000 mg | ORAL_TABLET | Freq: Four times a day (QID) | ORAL | 0 refills | Status: AC

## 2017-09-12 MED ORDER — PROPOFOL 10 MG/ML IV BOLUS
INTRAVENOUS | Status: AC
  Filled 2017-09-12: qty 20

## 2017-09-12 MED ORDER — PHENYLEPHRINE HCL 10 MG/ML IJ SOLN
INTRAMUSCULAR | Status: DC | PRN
  Administered 2017-09-12 (×3): 100 ug via INTRAVENOUS
  Administered 2017-09-12: 50 ug via INTRAVENOUS
  Administered 2017-09-12 (×2): 100 ug via INTRAVENOUS
  Administered 2017-09-12 (×2): 50 ug via INTRAVENOUS

## 2017-09-12 MED ORDER — METHYLENE BLUE 0.5 % INJ SOLN
INTRAVENOUS | Status: DC | PRN
  Administered 2017-09-12: 1.5 mL

## 2017-09-12 MED ORDER — IODINE STRONG (LUGOLS) 5 % PO SOLN
ORAL | Status: DC | PRN
  Administered 2017-09-12: 5 mL

## 2017-09-12 MED ORDER — IBUPROFEN 800 MG PO TABS: 800.0000 mg | ORAL_TABLET | Freq: Three times a day (TID) | ORAL | 1 refills | Status: DC | PRN

## 2017-09-12 MED ORDER — CEFAZOLIN SODIUM-DEXTROSE 2-4 GM/100ML-% IV SOLN
2.0000 g | INTRAVENOUS | Status: AC
  Administered 2017-09-12: 3 g via INTRAVENOUS

## 2017-09-12 MED ORDER — CELECOXIB 200 MG PO CAPS
ORAL_CAPSULE | ORAL | Status: AC
  Filled 2017-09-12: qty 2

## 2017-09-12 MED ORDER — FAMOTIDINE 20 MG PO TABS
20.0000 mg | ORAL_TABLET | Freq: Once | ORAL | Status: AC
  Administered 2017-09-12: 20 mg via ORAL

## 2017-09-12 MED ORDER — GABAPENTIN 800 MG PO TABS: 800.0000 mg | ORAL_TABLET | Freq: Every day | ORAL | 0 refills | Status: AC

## 2017-09-12 MED ORDER — SILVER NITRATE-POT NITRATE 75-25 % EX MISC
CUTANEOUS | Status: AC
  Filled 2017-09-12: qty 1

## 2017-09-12 MED ORDER — FENTANYL CITRATE (PF) 250 MCG/5ML IJ SOLN
INTRAMUSCULAR | Status: AC
  Filled 2017-09-12: qty 5

## 2017-09-12 MED ORDER — DOCUSATE SODIUM 100 MG PO CAPS: 100.0000 mg | ORAL_CAPSULE | Freq: Two times a day (BID) | ORAL | 0 refills | Status: DC

## 2017-09-12 MED ORDER — BUPIVACAINE HCL (PF) 0.5 % IJ SOLN
INTRAMUSCULAR | Status: AC
  Filled 2017-09-12: qty 30

## 2017-09-12 MED ORDER — CEFAZOLIN SODIUM-DEXTROSE 1-4 GM/50ML-% IV SOLN
INTRAVENOUS | Status: AC
  Filled 2017-09-12: qty 50

## 2017-09-12 MED ORDER — KETOROLAC TROMETHAMINE 30 MG/ML IJ SOLN
INTRAMUSCULAR | Status: DC | PRN
  Administered 2017-09-12: 30 mg via INTRAVENOUS

## 2017-09-12 MED ORDER — LACTATED RINGERS IV SOLN
INTRAVENOUS | Status: DC
  Administered 2017-09-12: 10:00:00 via INTRAVENOUS

## 2017-09-12 MED ORDER — GABAPENTIN 300 MG PO CAPS
ORAL_CAPSULE | ORAL | Status: AC
  Filled 2017-09-12: qty 3

## 2017-09-12 MED ORDER — SUGAMMADEX SODIUM 200 MG/2ML IV SOLN
INTRAVENOUS | Status: DC | PRN
  Administered 2017-09-12: 297.6 mg via INTRAVENOUS

## 2017-09-12 MED ORDER — FENTANYL CITRATE (PF) 100 MCG/2ML IJ SOLN
INTRAMUSCULAR | Status: AC
  Administered 2017-09-12: 25 ug via INTRAVENOUS
  Filled 2017-09-12: qty 2

## 2017-09-12 MED ORDER — METHYLENE BLUE 0.5 % INJ SOLN
INTRAVENOUS | Status: AC
  Filled 2017-09-12: qty 10

## 2017-09-12 SURGICAL SUPPLY — 58 items
BAG URO DRAIN 2000ML W/SPOUT (MISCELLANEOUS) ×3 IMPLANT
BASIN GRAD PLASTIC 32OZ STRL (MISCELLANEOUS) ×3 IMPLANT
BLADE SURG SZ11 CARB STEEL (BLADE) ×3 IMPLANT
CANISTER SUCT 1200ML W/VALVE (MISCELLANEOUS) ×3 IMPLANT
CATH FOLEY 2WAY  5CC 16FR (CATHETERS) ×1
CATH ROBINSON RED A/P 16FR (CATHETERS) ×3 IMPLANT
CATH URTH 16FR FL 2W BLN LF (CATHETERS) ×2 IMPLANT
CHLORAPREP W/TINT 26ML (MISCELLANEOUS) ×3 IMPLANT
DECANTER SPIKE VIAL GLASS SM (MISCELLANEOUS) ×3 IMPLANT
DERMABOND ADVANCED (GAUZE/BANDAGES/DRESSINGS) ×1
DERMABOND ADVANCED .7 DNX12 (GAUZE/BANDAGES/DRESSINGS) ×2 IMPLANT
DRAPE POUCH INSTRU U-SHP 10X18 (DRAPES) IMPLANT
DRSG TEGADERM 2-3/8X2-3/4 SM (GAUZE/BANDAGES/DRESSINGS) ×9 IMPLANT
FILTER LAP SMOKE EVAC STRL (MISCELLANEOUS) ×3 IMPLANT
GAUZE SPONGE NON-WVN 2X2 STRL (MISCELLANEOUS) ×4 IMPLANT
GLOVE BIO SURGEON STRL SZ7 (GLOVE) ×12 IMPLANT
GLOVE INDICATOR 7.5 STRL GRN (GLOVE) ×3 IMPLANT
GOWN STRL REUS W/ TWL LRG LVL3 (GOWN DISPOSABLE) ×4 IMPLANT
GOWN STRL REUS W/ TWL XL LVL3 (GOWN DISPOSABLE) ×2 IMPLANT
GOWN STRL REUS W/TWL LRG LVL3 (GOWN DISPOSABLE) ×2
GOWN STRL REUS W/TWL XL LVL3 (GOWN DISPOSABLE) ×1
IRRIGATION STRYKERFLOW (MISCELLANEOUS) ×2 IMPLANT
IRRIGATOR STRYKERFLOW (MISCELLANEOUS) ×3
IV LACTATED RINGERS 1000ML (IV SOLUTION) ×3 IMPLANT
IV NS 1000ML (IV SOLUTION) ×1
IV NS 1000ML BAXH (IV SOLUTION) ×2 IMPLANT
KIT PINK PAD W/HEAD ARE REST (MISCELLANEOUS) ×3
KIT PINK PAD W/HEAD ARM REST (MISCELLANEOUS) ×2 IMPLANT
KIT RM TURNOVER CYSTO AR (KITS) ×3 IMPLANT
LABEL OR SOLS (LABEL) ×3 IMPLANT
LIGASURE VESSEL 5MM BLUNT TIP (ELECTROSURGICAL) ×3 IMPLANT
MORCELLATOR XCISE  COR (MISCELLANEOUS) ×1
MORCELLATOR XCISE COR (MISCELLANEOUS) ×2 IMPLANT
NS IRRIG 500ML POUR BTL (IV SOLUTION) ×3 IMPLANT
PACK GYN LAPAROSCOPIC (MISCELLANEOUS) ×3 IMPLANT
PAD OB MATERNITY 4.3X12.25 (PERSONAL CARE ITEMS) ×3 IMPLANT
PAD PREP 24X41 OB/GYN DISP (PERSONAL CARE ITEMS) ×3 IMPLANT
POUCH SPECIMEN RETRIEVAL 10MM (ENDOMECHANICALS) IMPLANT
SCISSORS METZENBAUM CVD 33 (INSTRUMENTS) ×3 IMPLANT
SET CYSTO W/LG BORE CLAMP LF (SET/KITS/TRAYS/PACK) ×3 IMPLANT
SHEARS HARMONIC ACE PLUS 36CM (ENDOMECHANICALS) IMPLANT
SLEEVE ENDOPATH XCEL 5M (ENDOMECHANICALS) ×3 IMPLANT
SOLUTION ELECTROLUBE (MISCELLANEOUS) ×3 IMPLANT
SPONGE VERSALON 2X2 STRL (MISCELLANEOUS) ×2
STRIP CLOSURE SKIN 1/2X4 (GAUZE/BANDAGES/DRESSINGS) ×3 IMPLANT
STRIP CLOSURE SKIN 1/4X4 (GAUZE/BANDAGES/DRESSINGS) ×3 IMPLANT
SUT MNCRL AB 4-0 PS2 18 (SUTURE) IMPLANT
SUT VIC AB 0 CT1 36 (SUTURE) ×6 IMPLANT
SUT VIC AB 2-0 UR6 27 (SUTURE) ×3 IMPLANT
SUT VIC AB 4-0 SH 27 (SUTURE) ×2
SUT VIC AB 4-0 SH 27XANBCTRL (SUTURE) ×4 IMPLANT
SWABSTK COMLB BENZOIN TINCTURE (MISCELLANEOUS) ×3 IMPLANT
SYRINGE 10CC LL (SYRINGE) ×3 IMPLANT
TROCAR ENDO BLADELESS 11MM (ENDOMECHANICALS) ×3 IMPLANT
TROCAR XCEL NON-BLD 5MMX100MML (ENDOMECHANICALS) ×3 IMPLANT
TROCAR XCEL UNIV SLVE 11M 100M (ENDOMECHANICALS) ×6 IMPLANT
TUBING INSUF HEATED (TUBING) ×3 IMPLANT
TUBING INSUFFLATOR HI FLOW (MISCELLANEOUS) ×3 IMPLANT

## 2017-09-12 NOTE — Anesthesia Procedure Notes (Addendum)
Anesthesia Procedure Note 18G PIV left hand attempt X2

## 2017-09-12 NOTE — Discharge Instructions (Addendum)
AMBULATORY SURGERY  DISCHARGE INSTRUCTIONS   1) The drugs that you were given will stay in your system until tomorrow so for the next 24 hours you should not:  A) Drive an automobile B) Make any legal decisions C) Drink any alcoholic beverage   2) You may resume regular meals tomorrow.  Today it is better to start with liquids and gradually work up to solid foods.  You may eat anything you prefer, but it is better to start with liquids, then soup and crackers, and gradually work up to solid foods.   3) Please notify your doctor immediately if you have any unusual bleeding, trouble breathing, redness and pain at the surgery site, drainage, fever, or pain not relieved by medication.    4) Additional Instructions:        Please contact your physician with any problems or Same Day Surgery at [phone], Monday through Friday 6 am to 4 pm, or [place] [facility] number at [phone].   Discharge instructions after   supracervical laparoscopic hysterectomy  Signs and Symptoms to Report Call our office at [phone] if you have any of the following.   Fever over 100.4 degrees or higher  Severe stomach pain not relieved with pain medications  Bright red bleeding thats heavier than a period that does not slow with rest  To go the bathroom a lot (frequency), you cant hold your urine (urgency), or it hurts when you empty your bladder (urinate)  Chest pain  Shortness of breath  Pain in the calves of your legs  Severe nausea and vomiting not relieved with anti-nausea medications  Signs of infection around your wounds, such as redness, hot to touch, swelling, green/yellow drainage (like pus), bad smelling discharge  Any concerns  What You Can Expect after Surgery  You may see some pink tinged, bloody fluid and bruising around the wound. This is normal.  You may notice shoulder and neck pain. This is caused by the gas used during surgery to expand  your abdomen so your surgeon could get to the uterus easier.  You may have a sore throat because of the tube in your mouth during general anesthesia. This will go away in 2 to 3 days.  You may have some stomach cramps.  You may notice spotting on your panties.  You may have pain around the incision sites.   Activities after Your Discharge Follow these guidelines to help speed your recovery at home:  Do the coughing and deep breathing as you did in the hospital for 2 weeks. Use the small blue breathing device, called the incentive spirometer for 2 weeks.  Dont drive if you are in pain or taking narcotic pain medicine. You may drive when you can safely slam on the brakes, turn the wheel forcefully, and rotate your torso comfortably. This is typically 1-2 weeks. Practice in a parking lot or side street prior to attempting to drive regularly.   Ask others to help with household chores for 4 weeks.  Do not lift anything heavier that 10 pounds for 4-6 weeks. This includes pets, children, and groceries.  Dont do strenuous activities, exercises, or sports like vacuuming, tennis, squash, etc. until your doctor says it is safe to do so. ---Maintain pelvic rest for 4 weeks. This means nothing in the vagina or rectum at all (no douching, tampons, intercourse) for 4 weeks.   Walk as you feel able. Rest often since it may take two or three weeks for your  energy level to return to normal.   You may climb stairs  Avoid constipation:   -Eat fruits, vegetables, and whole grains. Eat small meals as your appetite will take time to return to normal.   -Drink 6 to 8 glasses of water each day unless your doctor has told you to limit your fluids.   -Use a laxative or stool softener as needed if constipation becomes a problem. You may take Miralax, metamucil, Citrucil, Colace, Senekot, FiberCon, etc. If this does not relieve the constipation, try two tablespoons of Milk Of Magnesia every 8 hours until your  bowels move.   You may shower. Gently wash the wounds with a mild soap and water. Pat dry.  Do not get in a hot tub, swimming pool, etc. for 6 weeks.  Do not use lotions, oils, powders on the wounds.  Do not douche, use tampons, or have sex until your doctor says it is okay.  Take your pain medicine when you need it. The medicine may not work as well if the pain is bad.  Take the medicines you were taking before surgery. Other medications you will need are pain medications and possibly constipation and nausea medications (Zofran).

## 2017-09-12 NOTE — Anesthesia Procedure Notes (Signed)
Procedure Name: Intubation Date/Time: 09/12/2017 11:47 AM Performed by: Timoteo Expose Pre-anesthesia Checklist: Patient identified, Emergency Drugs available, Suction available, Patient being monitored and Timeout performed Patient Re-evaluated:Patient Re-evaluated prior to induction Oxygen Delivery Method: Circle system utilized Preoxygenation: Pre-oxygenation with 100% oxygen Induction Type: IV induction Laryngoscope Size: McGraph and 3 Grade View: Grade I Tube type: Oral Tube size: 7.0 mm Number of attempts: 1 Airway Equipment and Method: Stylet and Patient positioned with wedge pillow Placement Confirmation: ETT inserted through vocal cords under direct vision,  positive ETCO2,  CO2 detector and breath sounds checked- equal and bilateral Secured at: 22 cm Tube secured with: Tape Dental Injury: Teeth and Oropharynx as per pre-operative assessment  Future Recommendations: Recommend- induction with short-acting agent, and alternative techniques readily available

## 2017-09-12 NOTE — Op Note (Addendum)
Natalie Fisher PROCEDURE DATE: 09/12/2017  PREOPERATIVE DIAGNOSIS:  POSTOPERATIVE DIAGNOSIS: The same PROCEDURE: Supracervical laparoscopic hysterectomy bilateral salpingectomy SURGEON:  Dr. Benjaman Kindler ASSISTANT: Dr. Marcello Moores Schermerhorn Anesthesiologist:  Anesthesiologist: [place] Service, Jane Canary, MD CRNA: Nelda Marseille, CRNA; Timoteo Expose, CRNA  INDICATIONS: 41 y.o. with heavy vaginal bleeding and known fibroids,  here for definitive surgical management secondary to the indications listed under preoperative diagnoses; please see preoperative note for further details.  Risks of surgery were discussed with the patient including but not limited to: bleeding which may require transfusion or reoperation; infection which may require antibiotics; injury to bowel, bladder, ureters or other surrounding organs; need for additional procedures; thromboembolic phenomenon, incisional problems and other postoperative/anesthesia complications. Written informed consent was obtained.    FINDINGS:  Enlarged, boggy uterus consistent with adenomyosis, normal bilateral ovaries, normal tubes. Uterus weighing >250g.  ANESTHESIA:    General INTRAVENOUS FLUIDS:1000  ml ESTIMATED BLOOD LOSS:50 ml URINE OUTPUT: 500 ml   SPECIMENS: Uterus, bilateral fallopian tubes COMPLICATIONS: None immediate  PROCEDURE IN DETAIL:  The patient received prophalactic intravenous antibiotics and had sequential compression devices applied to her lower extremities while in the preoperative area.  She was then taken to the operating room where general anesthesia was administered and was found to be adequate.  She was placed in the dorsal lithotomy position, and was prepped and draped in a sterile manner.  A formal time out was performed with all team members present and in agreement.  A V-care uterine manipulator was placed at this time.  A Foley catheter was inserted into her bladder and attached to constant drainage.  Attention was turned to the abdomen and 0.5% Marcaine infused subq. A 26mm umbilical incision was made with the scalpel.  The Optiview 5-mm trocar and sleeve were then advanced without difficulty with the laparoscope under direct visualization into the abdomen.  The abdomen was then insufflated with carbon dioxide gas and adequate pneumoperitoneum was obtained.  A survey of the patient's pelvis and abdomen revealed the findings above.  Bilateral lower quadrant ports (5 mm on the right and 10 mm on the left) were then placed under direct visualization.  The pelvis was then carefully examined.  Attention was turned to the fallopian tubes; these were freed from the underlying mesosalpinx and the uterine attachments using the Ligasure device.  The bilateral round and broad ligaments were then clamped and transected with the Ligasure device.  The uterine artery was then skeletonized and a bladder flap was created.  The ureters were noted to be safely away from the area of dissection.  The bladder was then bluntly dissected off the lower uterine segment.    At this point, attention was turned to the uterine vessels, which were clamped and cauterized using the Ligasure on the left, and then the right. After the uterine blood flow at the level of the internal os was controlled, both arteries were cut with the Ligasure.  Good hemostasis was noted overall.  The monopolar scissors were used to transect the uterus at the level of the internal os. Cautery was used to seal the cervical canal.  Overall excellent hemostasis was noted.    Power morcellation was used to remove the uterus and tubes in pieces from the abdominal cavity. Suction and irrigation revealed no bleeding.   Attention was returned to the abdomen.The ureters were reexamined bilaterally and were pulsating normally. The abdominal pressure was reduced and hemostasis was confirmed.   The 19mm port site was closed with  two interrupted sutures at the fascia.  All  trocars were removed under direct visualization, and the abdomen was desufflated.  All skin incisions were closed with 4-0 Vicryl subcuticular stitches and Dermabond. The patient tolerated the procedures well.  All instruments, needles, and sponge counts were correct x 2. The patient was taken to the recovery room awake, extubated and in stable condition.

## 2017-09-12 NOTE — Anesthesia Preprocedure Evaluation (Addendum)
Anesthesia Evaluation  Patient identified by MRN, date of birth, ID band Patient awake    Reviewed: Allergy & Precautions, NPO status , Patient's Chart, lab work & pertinent test results  Airway Mallampati: II       Dental  (+) Teeth Intact   Pulmonary Current Smoker,     + decreased breath sounds      Cardiovascular Exercise Tolerance: Good hypertension, Pt. on medications  Rhythm:Regular Rate:Normal     Neuro/Psych negative neurological ROS  negative psych ROS   GI/Hepatic negative GI ROS, Neg liver ROS,   Endo/Other  Morbid obesity  Renal/GU negative Renal ROS  negative genitourinary   Musculoskeletal   Abdominal (+) + obese,   Peds negative pediatric ROS (+)  Hematology   Anesthesia Other Findings   Reproductive/Obstetrics                            Anesthesia Physical Anesthesia Plan  ASA: III  Anesthesia Plan: General   Post-op Pain Management:    Induction: Intravenous  PONV Risk Score and Plan: 1 and Ondansetron and Dexamethasone  Airway Management Planned: Oral ETT  Additional Equipment:   Intra-op Plan:   Post-operative Plan: Extubation in OR  Informed Consent: I have reviewed the patients History and Physical, chart, labs and discussed the procedure including the risks, benefits and alternatives for the proposed anesthesia with the patient or authorized representative who has indicated his/her understanding and acceptance.     Plan Discussed with: CRNA  Anesthesia Plan Comments:         Anesthesia Quick Evaluation

## 2017-09-12 NOTE — OR Nursing (Signed)
3 port sites at umbilicus and right and left lower abd closed with liquid adhesive.No bleeding noted

## 2017-09-12 NOTE — Anesthesia Post-op Follow-up Note (Signed)
Anesthesia QCDR form completed.        

## 2017-09-12 NOTE — Interval H&P Note (Signed)
History and Physical Interval Note:  09/12/2017 10:39 AM  Natalie Fisher  has presented today for surgery, with the diagnosis of AUB  The various methods of treatment have been discussed with the patient and family. After consideration of risks, benefits and other options for treatment, the patient has consented to  Procedure(s): LAPAROSCOPIC SUPRACERVICAL HYSTERECTOMY (N/A) LAPAROSCOPIC BILATERAL SALPINGECTOMY (Bilateral) as a surgical intervention .  The patient's history has been reviewed, patient examined, no change in status, stable for surgery.  I have reviewed the patient's chart and labs.  Questions were answered to the patient's satisfaction.    After discussion with the patient, we have determined to proceed with EXcite morcellation technique, and she has given [place] permission to use power morcellation if needed, instead of using a laparotomy.   Her blood pressure was elevated on arrival. She does have HTN, and her repeat blood pressure was 148/94. She is on amlodipine, HCTZ and she did take them today.   Benjaman Kindler

## 2017-09-12 NOTE — Transfer of Care (Signed)
Immediate Anesthesia Transfer of Care Note  Patient: Natalie Fisher  Procedure(s) Performed: LAPAROSCOPIC SUPRACERVICAL HYSTERECTOMY (N/A ) LAPAROSCOPIC BILATERAL SALPINGECTOMY (Bilateral )  Patient Location: PACU  Anesthesia Type:General  Level of Consciousness: awake  Airway & Oxygen Therapy: Patient Spontanous Breathing  Post-op Assessment: Report given to RN  Post vital signs: Reviewed  Last Vitals:  Vitals:   09/12/17 0935 09/12/17 1042  BP: (!) 178/108 (!) 149/94  Pulse: 100   Resp: 18   Temp: 37.3 C   SpO2: 100%     Last Pain:  Vitals:   09/12/17 0935  TempSrc: Oral         Complications: No apparent anesthesia complications

## 2017-09-13 ENCOUNTER — Encounter: Payer: Self-pay | Admitting: Obstetrics and Gynecology

## 2017-09-13 NOTE — Anesthesia Postprocedure Evaluation (Signed)
Anesthesia Post Note  Patient: Natalie Fisher  Procedure(s) Performed: LAPAROSCOPIC SUPRACERVICAL HYSTERECTOMY (N/A ) LAPAROSCOPIC BILATERAL SALPINGECTOMY (Bilateral )  Patient location during evaluation: PACU Anesthesia Type: General Level of consciousness: awake Pain management: pain level controlled Respiratory status: spontaneous breathing Cardiovascular status: stable Anesthetic complications: no     Last Vitals:  Vitals:   09/12/17 1539 09/12/17 1656  BP: 137/85 (!) (P) 150/87  Pulse: 86 (P) 89  Resp: 18 (P) 16  Temp: 36.6 C   SpO2: 100% (P) 100%    Last Pain:  Vitals:   09/12/17 1539  TempSrc: Oral  PainSc: 3                  VAN STAVEREN,Lorilyn Laitinen

## 2017-09-15 LAB — SURGICAL PATHOLOGY

## 2017-09-21 ENCOUNTER — Inpatient Hospital Stay: Payer: 59

## 2017-09-21 VITALS — BP 150/90 | HR 88 | Temp 98.0°F | Resp 18

## 2017-09-21 DIAGNOSIS — D500 Iron deficiency anemia secondary to blood loss (chronic): Secondary | ICD-10-CM

## 2017-09-21 MED ORDER — SODIUM CHLORIDE 0.9 % IV SOLN
Freq: Once | INTRAVENOUS | Status: AC
Start: 2017-09-21 — End: 2017-09-21
  Administered 2017-09-21: 14:00:00 via INTRAVENOUS
  Filled 2017-09-21: qty 1000

## 2017-09-21 MED ORDER — SODIUM CHLORIDE 0.9 % IV SOLN
510.0000 mg | Freq: Once | INTRAVENOUS | Status: AC
  Administered 2017-09-21: 510 mg via INTRAVENOUS
  Filled 2017-09-21: qty 17

## 2017-09-27 DIAGNOSIS — R300 Dysuria: Secondary | ICD-10-CM | POA: Diagnosis not present

## 2017-09-29 ENCOUNTER — Inpatient Hospital Stay: Payer: 59 | Attending: Oncology | Admitting: *Deleted

## 2017-09-29 DIAGNOSIS — Z79899 Other long term (current) drug therapy: Secondary | ICD-10-CM | POA: Insufficient documentation

## 2017-09-29 DIAGNOSIS — N920 Excessive and frequent menstruation with regular cycle: Secondary | ICD-10-CM | POA: Diagnosis not present

## 2017-09-29 DIAGNOSIS — I10 Essential (primary) hypertension: Secondary | ICD-10-CM | POA: Diagnosis not present

## 2017-09-29 DIAGNOSIS — E538 Deficiency of other specified B group vitamins: Secondary | ICD-10-CM | POA: Diagnosis not present

## 2017-09-29 DIAGNOSIS — Z8041 Family history of malignant neoplasm of ovary: Secondary | ICD-10-CM | POA: Insufficient documentation

## 2017-09-29 DIAGNOSIS — F17210 Nicotine dependence, cigarettes, uncomplicated: Secondary | ICD-10-CM | POA: Diagnosis not present

## 2017-09-29 DIAGNOSIS — Z90710 Acquired absence of both cervix and uterus: Secondary | ICD-10-CM | POA: Diagnosis not present

## 2017-09-29 DIAGNOSIS — D500 Iron deficiency anemia secondary to blood loss (chronic): Secondary | ICD-10-CM

## 2017-09-29 LAB — CBC WITH DIFFERENTIAL/PLATELET
BASOS PCT: 0 %
Basophils Absolute: 0 10*3/uL (ref 0–0.1)
EOS ABS: 0.1 10*3/uL (ref 0–0.7)
EOS PCT: 1 %
HCT: 37.7 % (ref 35.0–47.0)
HEMOGLOBIN: 12.4 g/dL (ref 12.0–16.0)
Lymphocytes Relative: 23 %
Lymphs Abs: 2 10*3/uL (ref 1.0–3.6)
MCH: 28.7 pg (ref 26.0–34.0)
MCHC: 32.9 g/dL (ref 32.0–36.0)
MCV: 87.4 fL (ref 80.0–100.0)
MONOS PCT: 7 %
Monocytes Absolute: 0.6 10*3/uL (ref 0.2–0.9)
NEUTROS PCT: 69 %
Neutro Abs: 5.9 10*3/uL (ref 1.4–6.5)
PLATELETS: 358 10*3/uL (ref 150–440)
RBC: 4.31 MIL/uL (ref 3.80–5.20)
RDW: 18.8 % — AB (ref 11.5–14.5)
WBC: 8.7 10*3/uL (ref 3.6–11.0)

## 2017-09-29 LAB — IRON AND TIBC
IRON: 46 ug/dL (ref 28–170)
SATURATION RATIOS: 15 % (ref 10.4–31.8)
TIBC: 307 ug/dL (ref 250–450)
UIBC: 261 ug/dL

## 2017-09-29 LAB — FERRITIN: Ferritin: 211 ng/mL (ref 11–307)

## 2017-09-30 ENCOUNTER — Encounter: Payer: Self-pay | Admitting: Oncology

## 2017-09-30 ENCOUNTER — Inpatient Hospital Stay: Payer: 59

## 2017-09-30 ENCOUNTER — Inpatient Hospital Stay (HOSPITAL_BASED_OUTPATIENT_CLINIC_OR_DEPARTMENT_OTHER): Payer: 59 | Admitting: Oncology

## 2017-09-30 VITALS — BP 152/101 | HR 85 | Temp 98.1°F | Wt 323.0 lb

## 2017-09-30 DIAGNOSIS — D500 Iron deficiency anemia secondary to blood loss (chronic): Secondary | ICD-10-CM

## 2017-09-30 DIAGNOSIS — N920 Excessive and frequent menstruation with regular cycle: Secondary | ICD-10-CM | POA: Diagnosis not present

## 2017-09-30 DIAGNOSIS — E538 Deficiency of other specified B group vitamins: Secondary | ICD-10-CM | POA: Diagnosis not present

## 2017-09-30 DIAGNOSIS — Z90710 Acquired absence of both cervix and uterus: Secondary | ICD-10-CM

## 2017-09-30 DIAGNOSIS — I10 Essential (primary) hypertension: Secondary | ICD-10-CM

## 2017-09-30 DIAGNOSIS — Z79899 Other long term (current) drug therapy: Secondary | ICD-10-CM

## 2017-09-30 DIAGNOSIS — Z8041 Family history of malignant neoplasm of ovary: Secondary | ICD-10-CM

## 2017-09-30 DIAGNOSIS — F17210 Nicotine dependence, cigarettes, uncomplicated: Secondary | ICD-10-CM

## 2017-09-30 DIAGNOSIS — Z720 Tobacco use: Secondary | ICD-10-CM

## 2017-09-30 NOTE — Progress Notes (Signed)
Hematology/Oncology Consult note [facility] Telephone:([phone] [phone]  CONSULT NOTE Patient Care Team: Patient, No Pcp Per as PCP - General (General Practice)  REFERRING PROVIDER: Carmen:  Evaluation and management of anemia, optimizing anemia before surgery.   HISTORY OF PRESENTING ILLNESS:   41 y.o.  female with PMH listed below who presents for follow up of iron deficiency anemia.  Patient reports history iron deficiency anemia due to heavy menstrual period/blood loss. Records from [place] was reviewed.   She has Gyn work up and found to have fundal fibroid. Previously she has had hormonal treatment including contraceptives. Had D&C in 2015. Last pap smear was 10/2016, HPV was negative. She also has a history of B12 deficiency.  Patient received 2 doses of ferriheme. And she underwent hysterectomy. Per patient she tolerated procedure well. she feels her energy has significantly improved.   ROS:  Review of Systems  Constitutional: Negative for fatigue and fever.  HENT:  Negative.  Negative for mouth sores.   Eyes: Negative.  Negative for eye problems.  Respiratory: Negative for cough, hemoptysis and shortness of breath.   Cardiovascular: Negative.   Gastrointestinal: Negative.  Negative for blood in stool.  Endocrine: Negative.  Negative for hot flashes.  Genitourinary: Negative.  Negative for frequency.   Musculoskeletal: Negative.  Negative for flank pain.  Skin: Negative for rash.  Neurological: Negative.  Negative for headaches.  Hematological: Negative.  Negative for adenopathy.  Psychiatric/Behavioral: Negative for confusion.    MEDICAL HISTORY:  Past Medical History:  Diagnosis Date  . Abnormal bleeding in menstrual cycle   . Allergy   . Anemia   . Breast mass    Left Breast 9 oclock / Right Breast 3oclock  . History of bladder infections   .  Hypertension     SURGICAL HISTORY: Past Surgical History:  Procedure Laterality Date  . DILATION AND CURETTAGE OF UTERUS    . LAPAROSCOPIC BILATERAL SALPINGECTOMY Bilateral 09/12/2017   Procedure: LAPAROSCOPIC BILATERAL SALPINGECTOMY;  Surgeon: Benjaman Kindler, MD;  Location: [facility] ORS;  Service: Gynecology;  Laterality: Bilateral;  . LAPAROSCOPIC SUPRACERVICAL HYSTERECTOMY N/A 09/12/2017   Procedure: LAPAROSCOPIC SUPRACERVICAL HYSTERECTOMY;  Surgeon: Benjaman Kindler, MD;  Location: [facility] ORS;  Service: Gynecology;  Laterality: N/A;    SOCIAL HISTORY: Social History   Social History  . Marital status: Single    Spouse name: N/A  . Number of children: N/A  . Years of education: N/A   Occupational History  . Not on file.   Social History Main Topics  . Smoking status: Light Tobacco Smoker    Packs/day: 0.25    Years: 20.00    Types: Cigars  . Smokeless tobacco: Never Used  . Alcohol use Yes     Comment: twice mthly  . Drug use: No  . Sexual activity: Yes   Other Topics Concern  . Not on file   Social History Narrative  . No narrative on file    FAMILY HISTORY: Family History  Problem Relation Age of Onset  . Anemia Mother   . Diabetes Father   . Heart disease Father   . Stroke Father   . Heart disease Maternal Grandmother   . Ovarian cancer Paternal Grandmother   . Diabetes Paternal Grandmother   . Heart disease Paternal Grandmother   . Breast cancer Neg Hx     ALLERGIES:  has No Known Allergies.  MEDICATIONS:  Current Outpatient Prescriptions  Medication Sig Dispense  Refill  . amLODipine (NORVASC) 5 MG tablet Take 5 mg by mouth daily.    . Cyanocobalamin (VITAMIN B-12) 500 MCG LOZG Take 500 mcg by mouth daily.    Marland Kitchen docusate sodium (COLACE) 100 MG capsule Take 1 capsule (100 mg total) by mouth 2 (two) times daily. To keep stools soft 30 capsule 0  . ferrous sulfate 325 (65 FE) MG tablet Take 325 mg by mouth 2 (two) times daily with a meal.    .  gabapentin (NEURONTIN) 800 MG tablet Take 1 tablet (800 mg total) by mouth at bedtime. For up to 7 days after surgery 3 tablet 0  . hydrochlorothiazide (HYDRODIURIL) 25 MG tablet Take 25 mg by mouth daily.    Marland Kitchen ibuprofen (ADVIL,MOTRIN) 800 MG tablet Take 1 tablet (800 mg total) by mouth every 8 (eight) hours as needed for moderate pain. 30 tablet 1  . norethindrone-ethinyl estradiol (BALZIVA) 0.4-35 MG-MCG tablet Take 1 tablet by mouth daily.    Marland Kitchen oxycodone (OXY-IR) 5 MG capsule Take 1 capsule (5 mg total) by mouth every 6 (six) hours as needed for pain. 15 capsule 0   No current facility-administered medications for this visit.       Marland Kitchen  PHYSICAL EXAMINATION: ECOG PERFORMANCE STATUS: 1 - Symptomatic but completely ambulatory Vitals:   09/30/17 1345  BP: (!) 152/101  Pulse: 85  Temp: 98.1 F (36.7 C)   Filed Weights   09/30/17 1345  Weight: (!) 323 lb (146.5 kg)    GENERAL:Alert, no distress and comfortable.  EYES: no pallor or icterus OROPHARYNX: no thrush or ulceration;  NECK: supple, no masses felt LYMPH:  no palpable lymphadenopathy in the cervical, axillary or inguinal regions LUNGS: clear to auscultation and  No wheeze or crackles HEART/CVS: regular rate & rhythm and no murmurs; No lower extremity edema ABDOMEN: abdomen soft, non-tender and normal bowel sounds Musculoskeletal:no cyanosis of digits and no clubbing  PSYCH: alert & oriented x 3  NEURO: no focal motor/sensory deficits SKIN:  no rashes or significant lesions  LABORATORY DATA:  I have reviewed the data as listed Recent labs from [place] was reviewed.    Recent Labs  09/02/17 1132  NA 136  K 4.0  CL 105  CO2 24  GLUCOSE 74  BUN 11  CREATININE 0.55  CALCIUM 9.2  GFRNONAA >60  GFRAA >60  PROT 7.6  ALBUMIN 3.5  AST 15  ALT 11*  ALKPHOS 73  BILITOT 0.3    ASSESSMENT & PLAN:  1. Iron deficiency anemia due to chronic blood loss   2. Tobacco abuse    Hemoglobin improves  significantly as well as iron store.  Smoke cessation was discussed in detail again. Cessation program information was provided.   All questions were answered. The patient knows to call the clinic with any problems questions or concerns. Return of visit: 6 months with cbc, ferritin, iron TIBC done 1 day prior to visit Thank you for this kind referral and the opportunity to participate in the care of this patient. A copy of today's note is routed to referring provider Dr.Beasley.    Earlie Server, MD, PhD Hematology Oncology [facility] Pager- [phone] 09/30/2017

## 2017-09-30 NOTE — Progress Notes (Signed)
Patient here today for follow up.   

## 2017-12-07 DIAGNOSIS — Z01419 Encounter for gynecological examination (general) (routine) without abnormal findings: Secondary | ICD-10-CM | POA: Diagnosis not present

## 2017-12-07 DIAGNOSIS — R600 Localized edema: Secondary | ICD-10-CM | POA: Diagnosis not present

## 2017-12-07 DIAGNOSIS — I10 Essential (primary) hypertension: Secondary | ICD-10-CM | POA: Diagnosis not present

## 2018-03-30 ENCOUNTER — Inpatient Hospital Stay: Payer: 59

## 2018-03-31 ENCOUNTER — Inpatient Hospital Stay: Payer: 59 | Admitting: Oncology

## 2018-08-02 DIAGNOSIS — Z0000 Encounter for general adult medical examination without abnormal findings: Secondary | ICD-10-CM | POA: Diagnosis not present

## 2018-08-02 DIAGNOSIS — R61 Generalized hyperhidrosis: Secondary | ICD-10-CM | POA: Diagnosis not present

## 2018-08-02 DIAGNOSIS — I10 Essential (primary) hypertension: Secondary | ICD-10-CM | POA: Diagnosis not present

## 2018-08-02 DIAGNOSIS — R3129 Other microscopic hematuria: Secondary | ICD-10-CM | POA: Diagnosis not present

## 2018-08-16 ENCOUNTER — Other Ambulatory Visit: Payer: Self-pay | Admitting: Internal Medicine

## 2018-08-16 DIAGNOSIS — I10 Essential (primary) hypertension: Secondary | ICD-10-CM

## 2018-08-16 DIAGNOSIS — E876 Hypokalemia: Secondary | ICD-10-CM

## 2018-09-07 ENCOUNTER — Ambulatory Visit
Admission: RE | Admit: 2018-09-07 | Discharge: 2018-09-07 | Disposition: A | Discharge status: Home / Self Care | Payer: Commercial Managed Care - HMO | Source: Ambulatory Visit | Attending: Internal Medicine | Admitting: Internal Medicine

## 2018-09-07 DIAGNOSIS — I779 Disorder of arteries and arterioles, unspecified: Secondary | ICD-10-CM | POA: Insufficient documentation

## 2018-09-07 DIAGNOSIS — E876 Hypokalemia: Secondary | ICD-10-CM | POA: Diagnosis not present

## 2018-09-07 DIAGNOSIS — I10 Essential (primary) hypertension: Secondary | ICD-10-CM | POA: Diagnosis not present

## 2018-09-07 DIAGNOSIS — R3129 Other microscopic hematuria: Secondary | ICD-10-CM | POA: Diagnosis not present

## 2018-09-15 DIAGNOSIS — E876 Hypokalemia: Secondary | ICD-10-CM | POA: Diagnosis not present

## 2018-09-15 DIAGNOSIS — R3129 Other microscopic hematuria: Secondary | ICD-10-CM | POA: Diagnosis not present

## 2018-09-22 DIAGNOSIS — I152 Hypertension secondary to endocrine disorders: Secondary | ICD-10-CM | POA: Diagnosis not present

## 2018-09-22 DIAGNOSIS — E269 Hyperaldosteronism, unspecified: Secondary | ICD-10-CM | POA: Diagnosis not present

## 2018-09-22 DIAGNOSIS — E876 Hypokalemia: Secondary | ICD-10-CM | POA: Diagnosis not present

## 2018-09-22 DIAGNOSIS — Z23 Encounter for immunization: Secondary | ICD-10-CM | POA: Diagnosis not present

## 2018-11-06 IMAGING — CT CT ABDOMEN W/O CM
2 of 4 series · 15 of 46 positions shown, 17 images · non-contrast
Comparison: None.

CLINICAL DATA: Hypokalemia. Poor hypertension control. Excess
sweating. Microscopic hematuria.

EXAM:
CT ABDOMEN WITHOUT CONTRAST
TECHNIQUE: Multidetector CT imaging of the abdomen was performed following the
standard protocol without IV contrast.

[Series 2: axial st adrenals · axial · 0.91mm/px · z∈[-1384,-1102]mm · 12 of 157 slices shown, 14 images]
[im 8/157  soft-tissue]
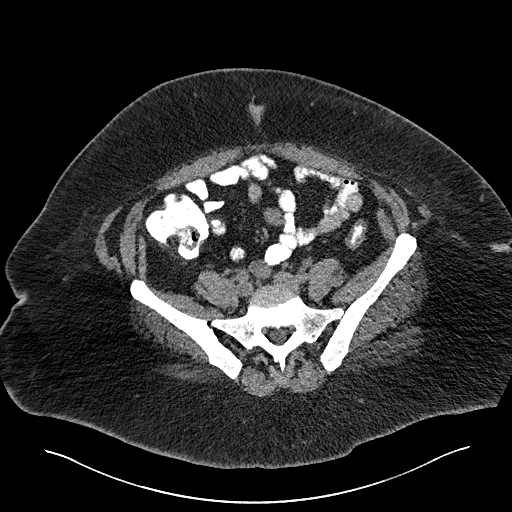
[im 8/157  bone]
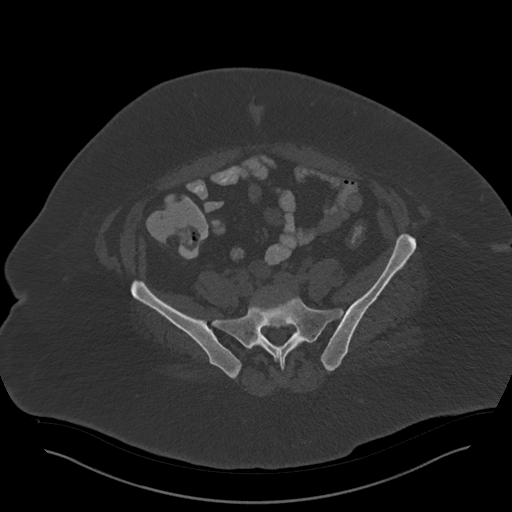
[im 24/157  soft-tissue]
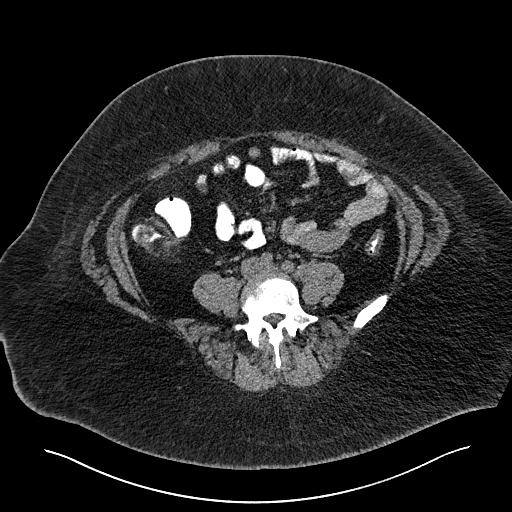
[im 32/157  soft-tissue]
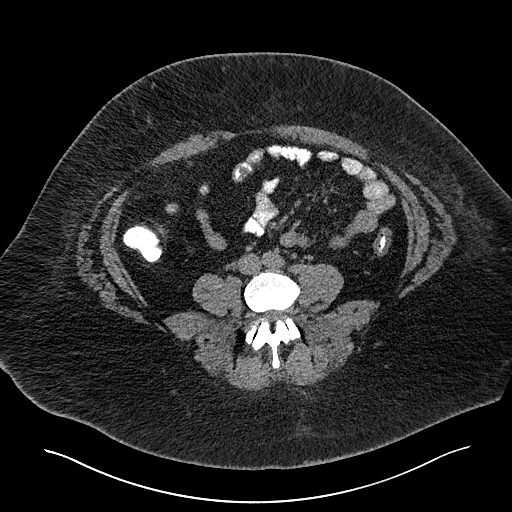
[im 47/157  soft-tissue]
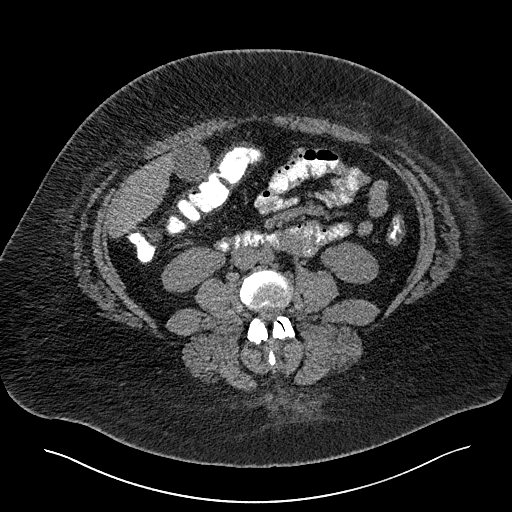
[im 63/157  soft-tissue]
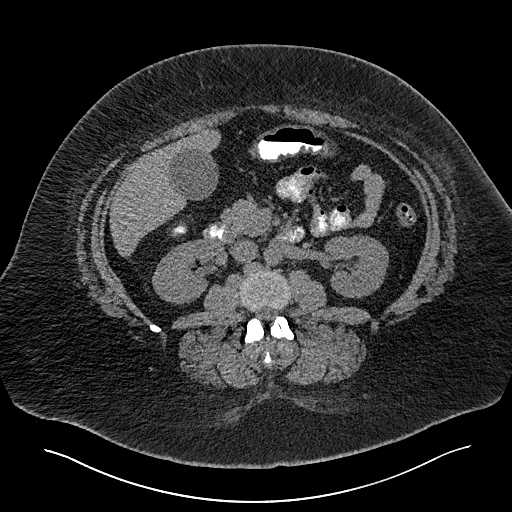
[im 71/157  soft-tissue]
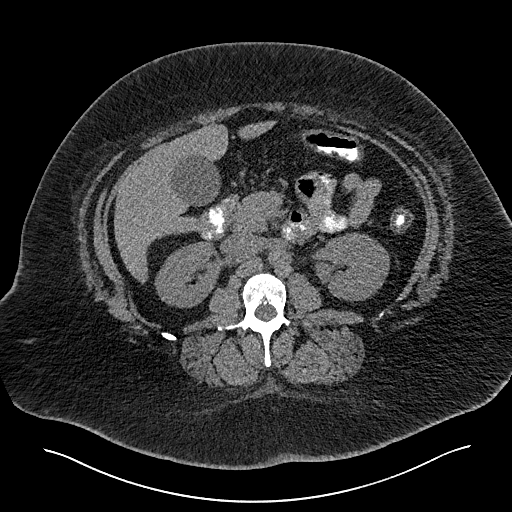
[im 86/157  soft-tissue]
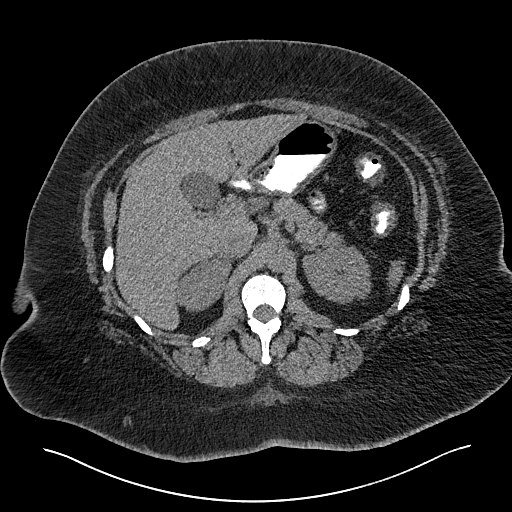
[im 94/157  soft-tissue]
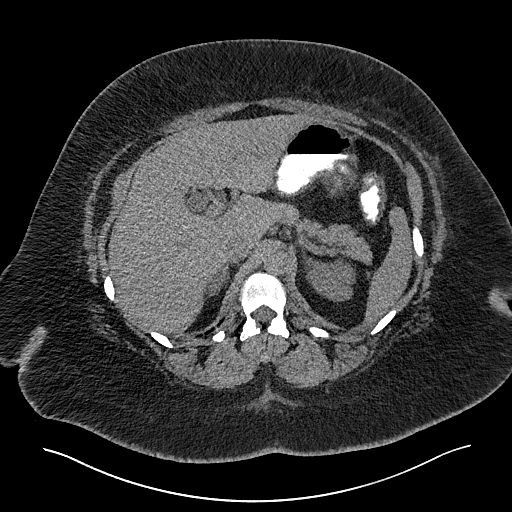
[im 110/157  soft-tissue]
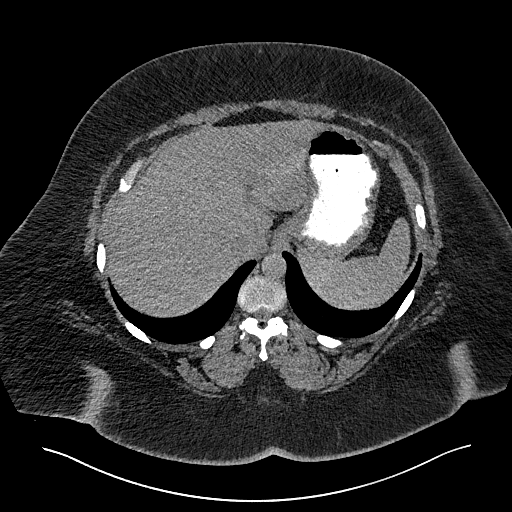
[im 110/157  bone]
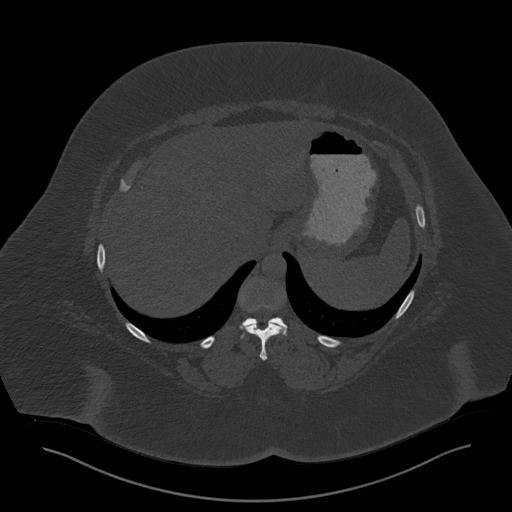
[im 125/157  soft-tissue]
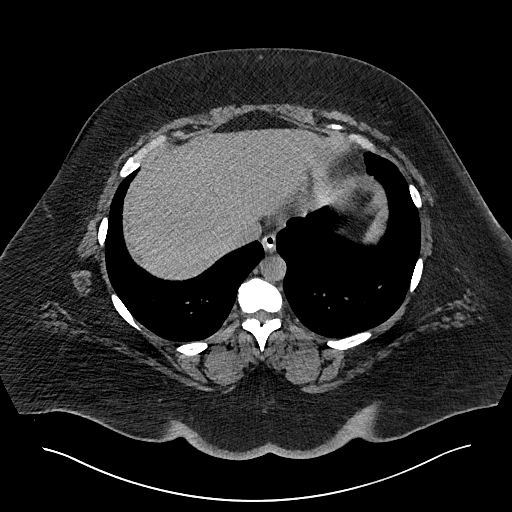
[im 133/157  soft-tissue]
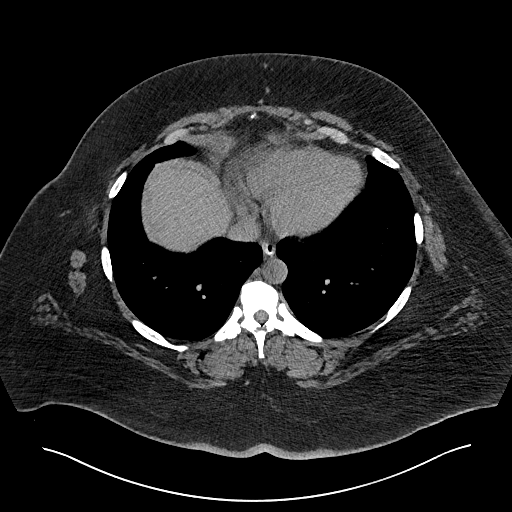
[im 149/157  soft-tissue]
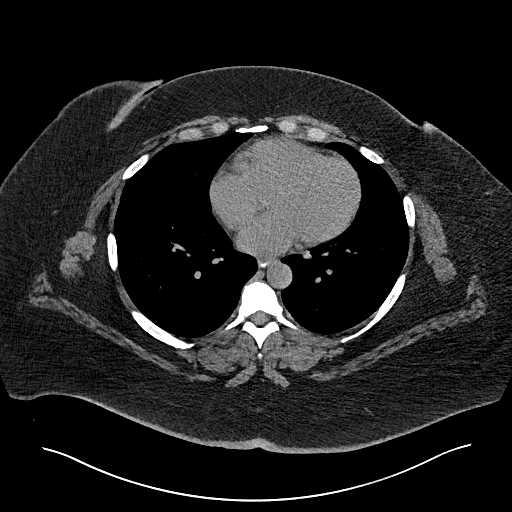

[Series 5: cor st adrenals · coronal · 0.61mm/px · 3 of 232 slices shown]
[im 78/232  soft-tissue]
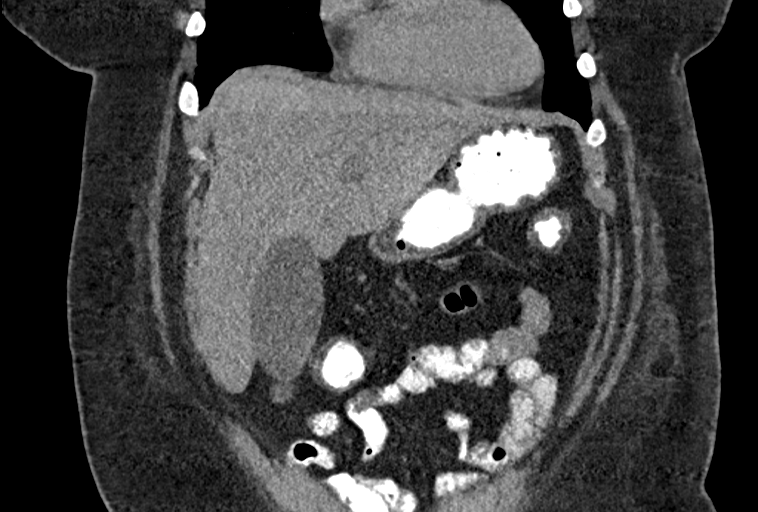
[im 103/232  soft-tissue]
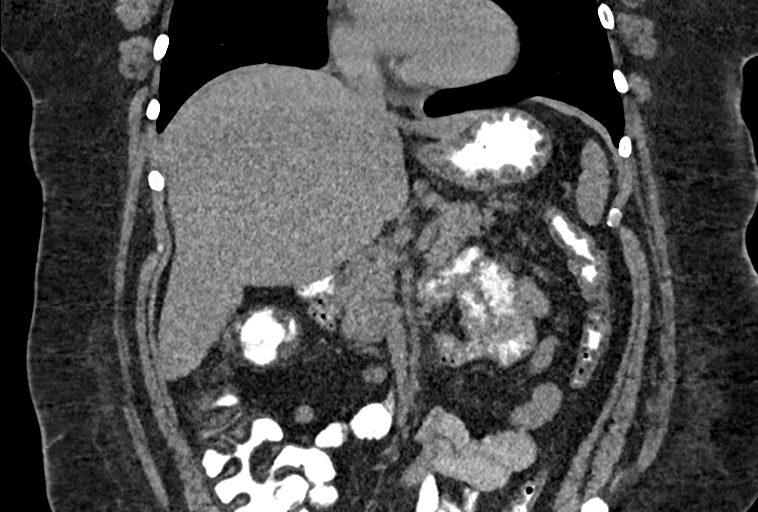
[im 129/232  soft-tissue]
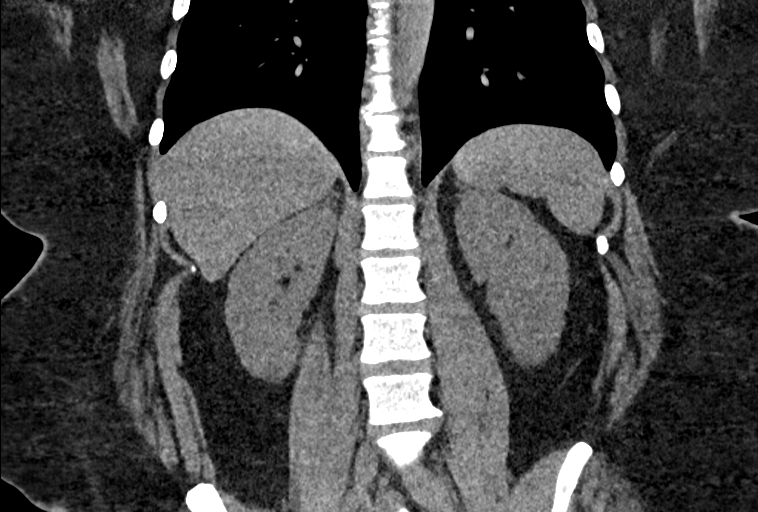

[15 of 46 positions shown; findings below may reference images not displayed]

FINDINGS: Lower chest: Clear lung bases.

Hepatobiliary: Enlarged liver measuring 21 cm in craniocaudal
length. Moderately distended gallbladder without stones, gross
gallbladder wall thickening, or pericholecystic inflammation
identified. No biliary dilatation.

Pancreas: Unremarkable.

Spleen: Unremarkable.

Adrenals/Urinary Tract: Unremarkable adrenal glands. No gross renal
mass on this unenhanced study. No renal calculi or hydronephrosis.
No proximal ureteral dilatation.

Stomach/Bowel: The stomach is within normal limits. Abdominal small
and large bowel loops are nondilated. Submucosal fat deposition is
noted in the ascending colon. The visualized portion of the appendix
is unremarkable.

Vascular/Lymphatic: Normal caliber of the abdominal aorta. Rounded
soft tissue measuring 2.5 cm diameter is partially visualized
inferior to the aortic bifurcation. No enlarged lymph nodes are
identified or proximally in the abdomen

Other: No abdominal ascites.

Musculoskeletal: Multilevel lumbar facet arthrosis, severe at L4-5.
IMPRESSION: 1. No adrenal mass.
2. Partially visualized 2.5 cm rounded soft tissue inferior to the
aortic bifurcation, indeterminate. This may represent an unopacified
bowel loop, however a mass or enlarged lymph node is not excluded
and contrast-enhanced CT of the abdomen and pelvis is recommended
for further evaluation.

## 2018-12-15 DIAGNOSIS — E269 Hyperaldosteronism, unspecified: Secondary | ICD-10-CM | POA: Diagnosis not present

## 2018-12-15 DIAGNOSIS — I152 Hypertension secondary to endocrine disorders: Secondary | ICD-10-CM | POA: Diagnosis not present

## 2018-12-15 DIAGNOSIS — E876 Hypokalemia: Secondary | ICD-10-CM | POA: Diagnosis not present

## 2018-12-19 DIAGNOSIS — R635 Abnormal weight gain: Secondary | ICD-10-CM | POA: Diagnosis not present

## 2018-12-19 DIAGNOSIS — E269 Hyperaldosteronism, unspecified: Secondary | ICD-10-CM | POA: Diagnosis not present

## 2018-12-19 DIAGNOSIS — I152 Hypertension secondary to endocrine disorders: Secondary | ICD-10-CM | POA: Diagnosis not present

## 2019-02-14 ENCOUNTER — Other Ambulatory Visit: Payer: Self-pay

## 2019-02-14 ENCOUNTER — Encounter: Payer: 59 | Attending: Internal Medicine | Admitting: Dietician

## 2019-02-14 ENCOUNTER — Encounter: Payer: Self-pay | Admitting: Dietician

## 2019-02-14 VITALS — Ht 65.0 in | Wt 333.4 lb

## 2019-02-14 DIAGNOSIS — I10 Essential (primary) hypertension: Secondary | ICD-10-CM | POA: Insufficient documentation

## 2019-02-14 DIAGNOSIS — Z6841 Body Mass Index (BMI) 40.0 and over, adult: Secondary | ICD-10-CM | POA: Insufficient documentation

## 2019-02-14 DIAGNOSIS — R635 Abnormal weight gain: Secondary | ICD-10-CM | POA: Diagnosis present

## 2019-02-14 NOTE — Patient Instructions (Signed)
   Plan ahead for more meals at or from home and fewer restaurant meals. Plan for several meals each week and shop for the foods needed. Can plan around sales/ specials at the store too.   Great job tracking food intake, exercising regularly, and making some healthy choices! Keep it up and steady weight loss should continue.

## 2019-02-14 NOTE — Progress Notes (Signed)
Medical Nutrition Therapy: Visit start time: 1100  end time: 1200  Assessment:  Diagnosis: obesity Past medical history: HTN Psychosocial issues/ stress concerns: none  Preferred learning method:  Nicki [place]   Current weight: 333.4lbs Height: 5'5" Medications, supplements: reconciled list in medical record  Progress and evaluation: Patient has begun losing weight recently, using a phone app to track calorie intake and keep to 1600kcal daily; started 12/28/18. Previous weight 12/19/18 was 353.4lbs at MD office. She has tried other diets in the past but was unable to maintain due to restrictions.   Physical activity: gym exercise 60 minutes, 5x a week; does exercise at home when unable to get to gym  Dietary Intake: (since 12/28/18) Usual eating pattern includes 3 meals and 1-2 snacks per day. Dining out frequency: 8 meals per week.  Breakfast: pop tarts; 2 eggs with cheese occ with meat no bread; not a big breakfast eater; occ frosted flakes (loves) Snack: none Lunch: usually out on workdays-- 3/17 [place] subs kettle chips, light lemonade; 3/16 jamaican jerk chicken with cabbage  Snack: fig newtons, italian ice, fruity candy ie skittles, starburst; jumbo pickle once a day Supper: cooks per menu for group home-- 3/17 sloppy joes no bun + slaw; eats same as residents but less. Has avoided burgers (favorite food) has had patty without bun.  Snack: same as pm Beverages: water, sunny delight in am; crystal light mix-ins  Nutrition Care Education: Topics covered: weight control Basic nutrition: basic food groups, appropriate nutrient balance, general nutrition guidelines    Weight control: benefits of weight control, guidance for balanced nutrition for 1600kcal daily intake and roles of protein, carbohydrate and fat; importance of generally low fat and low sugar food choices; healthy carb choices; appropriate food portions; role of exercise;  Advanced nutrition:  meal prep, dining  out  Nutritional Diagnosis:  Rolla-3.3 Overweight/obesity As related to history of excess calories and inactivity.  As evidenced by patient with current BMI of 55.48, following reduced calorie eating pattern for weight loss.  Intervention:   Instruction and discussion as noted above.  Patient has made significant lifestyle changes and has lost about 17lbs in the past 6 weeks.   Established goals to further control calories from restaurant meals and continue with weight loss while achieving a healthy nutrient balance.   Patient does not feel she needs RD follow-up at this time, but will schedule later if needed or call with concerns or questions.   Education Materials given:  . Plate Planner . Sample meal pattern, menus . Goals/ instructions  Learner/ who was taught:  . Patient    Level of understanding: Marland Kitchen Verbalizes/ demonstrates competency   Demonstrated degree of understanding via:   Teach back Learning barriers: . None  Willingness to learn/ readiness for change: . Eager, change in progress   Monitoring and Evaluation:  Dietary intake, exercise, and body weight      follow up: prn

## 2019-02-20 DIAGNOSIS — E269 Hyperaldosteronism, unspecified: Secondary | ICD-10-CM | POA: Diagnosis not present

## 2019-02-20 DIAGNOSIS — I152 Hypertension secondary to endocrine disorders: Secondary | ICD-10-CM | POA: Diagnosis not present

## 2019-02-27 DIAGNOSIS — I10 Essential (primary) hypertension: Secondary | ICD-10-CM | POA: Diagnosis not present

## 2019-02-27 DIAGNOSIS — B351 Tinea unguium: Secondary | ICD-10-CM | POA: Diagnosis not present

## 2022-04-29 ENCOUNTER — Encounter: Payer: Self-pay | Admitting: Oncology

## 2022-04-29 ENCOUNTER — Ambulatory Visit
Admission: EM | Admit: 2022-04-29 | Discharge: 2022-04-29 | Disposition: A | Discharge status: Home / Self Care | Payer: BC Managed Care – PPO

## 2022-04-29 DIAGNOSIS — H6501 Acute serous otitis media, right ear: Secondary | ICD-10-CM | POA: Diagnosis not present

## 2022-04-29 DIAGNOSIS — F17200 Nicotine dependence, unspecified, uncomplicated: Secondary | ICD-10-CM | POA: Diagnosis not present

## 2022-04-29 DIAGNOSIS — H9201 Otalgia, right ear: Secondary | ICD-10-CM | POA: Diagnosis not present

## 2022-04-29 MED ORDER — FLUTICASONE PROPIONATE 50 MCG/ACT NA SUSP
1.0000 | Freq: Every day | NASAL | 0 refills | Status: AC
Start: 2022-04-29 — End: 2022-05-06

## 2022-04-29 NOTE — ED Triage Notes (Signed)
Pt c/o sinus pressure and ear aches x3days.  Pt believes she has a sinus infection.

## 2022-04-29 NOTE — ED Provider Notes (Signed)
[facility]    CSN: [id number] Arrival date & time: 04/29/22  1304      History   Chief Complaint Chief Complaint  Patient presents with   Ear Pain   Nasal Congestion    HPI Natalie Fisher is a 46 y.o. female.   46 year old female patient, Natalie Fisher, presents to urgent care today complaining of nasal congestion and right ear pain for 3 days.  Patient smokes half pack a day patient states she forgot to take her blood pressure medicine today.  No treatments tried  The history is provided by the patient. No language interpreter was used.   Past Medical History:  Diagnosis Date   Abnormal bleeding in menstrual cycle    Allergy    Anemia    Breast mass    Left Breast 9 oclock / Right Breast 3oclock   History of bladder infections    Hypertension     Patient Active Problem List   Diagnosis Date Noted   Otalgia of right ear 04/29/2022   Right acute serous otitis media 04/29/2022   Smoker 04/29/2022   Iron deficiency anemia due to chronic blood loss 09/02/2017    Past Surgical History:  Procedure Laterality Date   DILATION AND CURETTAGE OF UTERUS     LAPAROSCOPIC BILATERAL SALPINGECTOMY Bilateral 09/12/2017   Procedure: LAPAROSCOPIC BILATERAL SALPINGECTOMY;  Surgeon: Benjaman Kindler, MD;  Location: [facility] ORS;  Service: Gynecology;  Laterality: Bilateral;   LAPAROSCOPIC SUPRACERVICAL HYSTERECTOMY N/A 09/12/2017   Procedure: LAPAROSCOPIC SUPRACERVICAL HYSTERECTOMY;  Surgeon: Benjaman Kindler, MD;  Location: [facility] ORS;  Service: Gynecology;  Laterality: N/A;    OB History   No obstetric history on file.      Home Medications    Prior to Admission medications   Medication Sig Start Date End Date Taking? Authorizing Provider  amLODipine (NORVASC) 5 MG tablet Take 5 mg by mouth daily.   Yes [provider]  BIOTIN PO Take by mouth.   Yes [provider]  Cyanocobalamin (VITAMIN B-12) 500 MCG LOZG Take 500 mcg by mouth daily.   Yes  [provider]  ferrous sulfate 325 (65 FE) MG tablet Take 325 mg by mouth 2 (two) times daily with a meal.   Yes [provider]  fluticasone (FLONASE) 50 MCG/ACT nasal spray Place 1 spray into both nostrils daily for 7 days. 03/05/64 03/04/49 Yes Girtha Kilgore, Jeanett Schlein, NP  hydrochlorothiazide (HYDRODIURIL) 25 MG tablet Take 25 mg by mouth daily.   Yes [provider]  lisinopril-hydrochlorothiazide (ZESTORETIC) 20-25 MG tablet Take 1 tablet by mouth daily. 11/27/21  Yes [provider]  Multiple Vitamins-Minerals (MULTIVITAMIN WITH MINERALS) tablet Take 1 tablet by mouth daily.   Yes [provider]  spironolactone (ALDACTONE) 25 MG tablet TAKE 1 TABLET BY MOUTH TWICE DAILY. DISCONTINUE HCTZ AND POTASSIUM SUPPLEMENT. 12/19/18  Yes [provider]  terbinafine (LAMISIL) 250 MG tablet Take 250 mg by mouth daily. 01/05/19  Yes [provider]  gabapentin (NEURONTIN) 800 MG tablet Take 1 tablet (800 mg total) by mouth at bedtime. For up to 7 days after surgery 09/12/17 09/15/17  Benjaman Kindler, MD    Family History Family History  Problem Relation Age of Onset   Anemia Mother    Diabetes Father    Heart disease Father    Stroke Father    Heart disease Maternal Grandmother    Ovarian cancer Paternal Grandmother    Diabetes Paternal Grandmother    Heart disease Paternal Grandmother  Breast cancer Neg Hx     Social History Social History   Tobacco Use   Smoking status: Light Smoker    Packs/day: 0.25    Years: 20.00    Pack years: 5.00    Types: Cigars, Cigarettes   Smokeless tobacco: Never  Vaping Use   Vaping Use: Never used  Substance Use Topics   Alcohol use: Yes    Comment: occasionl   Drug use: No     Allergies   Patient has no known allergies.   Review of Systems Review of Systems  All other systems reviewed and are negative.   Physical Exam Triage Vital Signs ED Triage Vitals  Enc Vitals Group     BP  --      Pulse --      Resp --      Temp --      Temp src --      SpO2 --      Weight 04/29/22 1346 292 lb (132.5 kg)     Height 04/29/22 1346 '5\' 5"'$  (1.651 m)     Head Circumference --      Peak Flow --      Pain Score 04/29/22 1345 6     Pain Loc --      Pain Edu? --      Excl. in [place]? --    No data found.  Updated Vital Signs BP (!) 148/103 (BP Location: Left Arm)   Pulse 61   Temp 98.8 F (37.1 C) (Oral)   Resp 18   Ht '5\' 5"'$  (1.651 m)   Wt 292 lb (132.5 kg)   LMP 08/13/2017   SpO2 96%   BMI 48.59 kg/m   Visual Acuity Right Eye Distance:   Left Eye Distance:   Bilateral Distance:    Right Eye Near:   Left Eye Near:    Bilateral Near:     Physical Exam Vitals and nursing note reviewed.  Constitutional:      General: She is not in acute distress.    Appearance: She is well-developed and well-groomed. She is morbidly obese.  HENT:     Head: Normocephalic and atraumatic.  Eyes:     General: Lids are normal. Vision grossly intact.     Extraocular Movements: Extraocular movements intact.     Conjunctiva/sclera: Conjunctivae normal.     Pupils: Pupils are equal, round, and reactive to light.  Neck:     Trachea: Trachea normal.  Cardiovascular:     Rate and Rhythm: Normal rate and regular rhythm.     Pulses: Normal pulses.     Heart sounds: Normal heart sounds. No murmur heard. Pulmonary:     Effort: Pulmonary effort is normal. No respiratory distress.     Breath sounds: Normal breath sounds and air entry.  Abdominal:     Palpations: Abdomen is soft.     Tenderness: There is no abdominal tenderness.  Musculoskeletal:        General: No swelling.     Cervical back: Normal range of motion and neck supple.  Skin:    General: Skin is warm and dry.     Capillary Refill: Capillary refill takes less than 2 seconds.  Neurological:     General: No focal deficit present.     Mental Status: She is alert and oriented to person, place, and time.     GCS: GCS eye  subscore is 4. GCS verbal subscore is 5. GCS motor subscore is 6.  Psychiatric:        Attention and Perception: Attention normal.        Mood and Affect: Mood normal.        Speech: Speech normal.        Behavior: Behavior normal. Behavior is cooperative.     UC Treatments / Results  Labs (all labs ordered are listed, but only abnormal results are displayed) Labs Reviewed - No data to display  EKG   Radiology No results found.  Procedures Procedures (including critical care time)  Medications Ordered in UC Medications - No data to display  Initial Impression / Assessment and Plan / UC Course  I have reviewed the triage vital signs and the nursing notes.  Pertinent labs & imaging results that were available during my care of the patient were reviewed by me and considered in my medical decision making (see chart for details).     Ddx: Right otalgia, seasonal allergies, smoker Final Clinical Impressions(s) / UC Diagnoses   Final diagnoses:  Otalgia of right ear  Right acute serous otitis media, recurrence not specified  Smoker     Discharge Instructions      Stop smoking as it will make your blood pressure go up. Take home meds as prescribed. We can write you a note for today,but we are unable to write clearance to return to work notes for Monday, will need to check with your PCP for clearance to work note. May take over the counter Coricidin HBP meds as label directed for symptom management.      ED Prescriptions     Medication Sig Dispense Auth. Provider   fluticasone (FLONASE) 50 MCG/ACT nasal spray Place 1 spray into both nostrils daily for 7 days. 9.9 mL Ondrea Dow, Jeanett Schlein, NP      PDMP not reviewed this encounter.   Tori Milks, NP 40/98/11 1513

## 2022-04-29 NOTE — Discharge Instructions (Addendum)
Stop smoking as it will make your blood pressure go up. Take home meds as prescribed. We can write you a note for today,but we are unable to write clearance to return to work notes for Monday, will need to check with your PCP for clearance to work note. May take over the counter Coricidin HBP meds as label directed for symptom management.

## 2023-05-24 DIAGNOSIS — I10 Essential (primary) hypertension: Secondary | ICD-10-CM | POA: Diagnosis not present

## 2023-05-24 DIAGNOSIS — E269 Hyperaldosteronism, unspecified: Secondary | ICD-10-CM | POA: Diagnosis not present

## 2023-05-24 DIAGNOSIS — E538 Deficiency of other specified B group vitamins: Secondary | ICD-10-CM | POA: Diagnosis not present

## 2023-05-24 DIAGNOSIS — R7303 Prediabetes: Secondary | ICD-10-CM | POA: Diagnosis not present

## 2023-05-31 DIAGNOSIS — I10 Essential (primary) hypertension: Secondary | ICD-10-CM | POA: Diagnosis not present

## 2023-05-31 DIAGNOSIS — R7303 Prediabetes: Secondary | ICD-10-CM | POA: Diagnosis not present

## 2023-05-31 DIAGNOSIS — Z0000 Encounter for general adult medical examination without abnormal findings: Secondary | ICD-10-CM | POA: Diagnosis not present

## 2023-05-31 DIAGNOSIS — F32A Depression, unspecified: Secondary | ICD-10-CM | POA: Diagnosis not present

## 2023-05-31 DIAGNOSIS — F419 Anxiety disorder, unspecified: Secondary | ICD-10-CM | POA: Diagnosis not present

## 2023-05-31 DIAGNOSIS — F17200 Nicotine dependence, unspecified, uncomplicated: Secondary | ICD-10-CM | POA: Diagnosis not present

## 2023-07-20 DIAGNOSIS — N760 Acute vaginitis: Secondary | ICD-10-CM | POA: Diagnosis not present

## 2023-09-06 ENCOUNTER — Other Ambulatory Visit: Payer: Self-pay

## 2023-09-06 MED ORDER — FLULAVAL 0.5 ML IM SUSY
PREFILLED_SYRINGE | INTRAMUSCULAR | 0 refills | Status: AC
  Filled 2023-09-06: qty 0.5, 1d supply, fill #0

## 2023-09-07 ENCOUNTER — Encounter: Payer: Self-pay | Admitting: Oncology

## 2023-09-09 ENCOUNTER — Other Ambulatory Visit: Payer: Self-pay

## 2024-07-04 DIAGNOSIS — I10 Essential (primary) hypertension: Secondary | ICD-10-CM | POA: Diagnosis not present

## 2024-07-04 DIAGNOSIS — R7303 Prediabetes: Secondary | ICD-10-CM | POA: Diagnosis not present

## 2024-07-04 DIAGNOSIS — F17200 Nicotine dependence, unspecified, uncomplicated: Secondary | ICD-10-CM | POA: Diagnosis not present

## 2024-10-15 ENCOUNTER — Encounter: Payer: Self-pay | Admitting: Oncology

## 2024-10-15 DIAGNOSIS — Z1211 Encounter for screening for malignant neoplasm of colon: Secondary | ICD-10-CM | POA: Diagnosis not present

## 2024-10-15 DIAGNOSIS — Z83719 Family history of colon polyps, unspecified: Secondary | ICD-10-CM | POA: Diagnosis not present

## 2024-10-18 ENCOUNTER — Encounter: Payer: Self-pay | Admitting: Gastroenterology

## 2024-10-24 ENCOUNTER — Encounter: Payer: Self-pay | Admitting: Gastroenterology

## 2024-10-29 ENCOUNTER — Ambulatory Visit: Admitting: Anesthesiology

## 2024-10-29 ENCOUNTER — Encounter
Admission: RE | Disposition: A | Discharge status: Home / Self Care | Payer: Self-pay | Source: Home / Self Care | Attending: Gastroenterology

## 2024-10-29 ENCOUNTER — Encounter: Payer: Self-pay | Admitting: Gastroenterology

## 2024-10-29 ENCOUNTER — Other Ambulatory Visit: Payer: Self-pay

## 2024-10-29 ENCOUNTER — Ambulatory Visit
Admission: RE | Admit: 2024-10-29 | Discharge: 2024-10-29 | Disposition: A | Discharge status: Home / Self Care | Attending: Gastroenterology | Admitting: Gastroenterology

## 2024-10-29 DIAGNOSIS — Z6841 Body Mass Index (BMI) 40.0 and over, adult: Secondary | ICD-10-CM | POA: Diagnosis not present

## 2024-10-29 DIAGNOSIS — K635 Polyp of colon: Secondary | ICD-10-CM | POA: Diagnosis not present

## 2024-10-29 DIAGNOSIS — K640 First degree hemorrhoids: Secondary | ICD-10-CM | POA: Diagnosis not present

## 2024-10-29 DIAGNOSIS — E66813 Obesity, class 3: Secondary | ICD-10-CM | POA: Diagnosis not present

## 2024-10-29 DIAGNOSIS — Z83719 Family history of colon polyps, unspecified: Secondary | ICD-10-CM | POA: Diagnosis not present

## 2024-10-29 DIAGNOSIS — F17210 Nicotine dependence, cigarettes, uncomplicated: Secondary | ICD-10-CM | POA: Diagnosis not present

## 2024-10-29 DIAGNOSIS — I10 Essential (primary) hypertension: Secondary | ICD-10-CM | POA: Diagnosis not present

## 2024-10-29 DIAGNOSIS — F17200 Nicotine dependence, unspecified, uncomplicated: Secondary | ICD-10-CM | POA: Diagnosis not present

## 2024-10-29 DIAGNOSIS — D122 Benign neoplasm of ascending colon: Secondary | ICD-10-CM | POA: Diagnosis not present

## 2024-10-29 DIAGNOSIS — Z1211 Encounter for screening for malignant neoplasm of colon: Secondary | ICD-10-CM | POA: Diagnosis not present

## 2024-10-29 DIAGNOSIS — Z90710 Acquired absence of both cervix and uterus: Secondary | ICD-10-CM | POA: Diagnosis not present

## 2024-10-29 HISTORY — PX: COLONOSCOPY: SHX5424

## 2024-10-29 SURGERY — COLONOSCOPY
Anesthesia: General

## 2024-10-29 MED ORDER — PROPOFOL 10 MG/ML IV BOLUS
INTRAVENOUS | Status: AC
  Filled 2024-10-29: qty 20

## 2024-10-29 MED ORDER — LIDOCAINE HCL (CARDIAC) PF 100 MG/5ML IV SOSY
PREFILLED_SYRINGE | INTRAVENOUS | Status: DC | PRN
  Administered 2024-10-29: 100 mg via INTRAVENOUS

## 2024-10-29 MED ORDER — SODIUM CHLORIDE 0.9 % IV SOLN: INTRAVENOUS | Status: DC

## 2024-10-29 MED ORDER — LIDOCAINE HCL (PF) 2 % IJ SOLN
INTRAMUSCULAR | Status: AC
  Filled 2024-10-29: qty 5

## 2024-10-29 MED ORDER — PROPOFOL 10 MG/ML IV BOLUS
INTRAVENOUS | Status: AC
  Filled 2024-10-29: qty 40

## 2024-10-29 MED ORDER — PROPOFOL 500 MG/50ML IV EMUL
INTRAVENOUS | Status: DC | PRN
  Administered 2024-10-29: 100 mg via INTRAVENOUS
  Administered 2024-10-29: 100 ug/kg/min via INTRAVENOUS

## 2024-10-29 NOTE — Op Note (Signed)
 [facility] Patient Name: Natalie Fisher Procedure Date: 10/29/2024 9:01 AM MRN: [id number] Account #: [id number] Date of Birth: [birth year] Admit Type: Outpatient Age: 48 Room: [facility] ENDO ROOM 2 Gender: Female Note Status: Finalized Instrument Name: Colon Scope [id number] Procedure:             Colonoscopy Indications:           Colon cancer screening in patient at increased risk:                         Family history of colon polyps in multiple 1st-degree                         relatives Providers:             Elspeth Ozell Onita ROSALEA, DO Referring MD:          Elspeth Ozell Onita DO, DO (Referring MD), Alda Carpen (Referring MD) Medicines:             Monitored Anesthesia Care Complications:         No immediate complications. Estimated blood loss:                         Minimal. Procedure:             Pre-Anesthesia Assessment:                        - Prior to the procedure, a History and Physical was                         performed, and patient medications and allergies were                         reviewed. The patient is competent. The risks and                         benefits of the procedure and the sedation options and                         risks were discussed with the patient. All questions                         were answered and informed consent was obtained.                         Patient identification and proposed procedure were                         verified by the physician, the nurse, the anesthetist                         and the technician in the endoscopy suite. Mental                         Status Examination: alert and oriented. Airway  Examination: normal oropharyngeal airway and neck                         mobility. Respiratory Examination: clear to                         auscultation. CV Examination: RRR, no murmurs, no S3                         or S4.  Prophylactic Antibiotics: The patient does not                         require prophylactic antibiotics. Prior                         Anticoagulants: The patient has taken no anticoagulant                         or antiplatelet agents. ASA Grade Assessment: III - A                         patient with severe systemic disease. After reviewing                         the risks and benefits, the patient was deemed in                         satisfactory condition to undergo the procedure. The                         anesthesia plan was to use monitored anesthesia care                         (MAC). Immediately prior to administration of                         medications, the patient was re-assessed for adequacy                         to receive sedatives. The heart rate, respiratory                         rate, oxygen saturations, blood pressure, adequacy of                         pulmonary ventilation, and response to care were                         monitored throughout the procedure. The physical                         status of the patient was re-assessed after the                         procedure.                        After obtaining informed consent, the colonoscope was  passed under direct vision. Throughout the procedure,                         the patient's blood pressure, pulse, and oxygen                         saturations were monitored continuously. The                         Colonoscope was introduced through the anus and                         advanced to the the terminal ileum, with                         identification of the appendiceal orifice and IC                         valve. The colonoscopy was performed without                         difficulty. The patient tolerated the procedure well.                         The quality of the bowel preparation was evaluated                         using the BBPS [facility] Bowel Preparation  Scale) with                         scores of: Right Colon = 2 (minor amount of residual                         staining, small fragments of stool and/or opaque                         liquid, but mucosa seen well), Transverse Colon = 3                         (entire mucosa seen well with no residual staining,                         small fragments of stool or opaque liquid) and Left                         Colon = 2 (minor amount of residual staining, small                         fragments of stool and/or opaque liquid, but mucosa                         seen well). The total BBPS score equals 7. The quality                         of the bowel preparation was good. The terminal ileum,  ileocecal valve, appendiceal orifice, and rectum were                         photographed. Findings:      The perianal and digital rectal examinations were normal. Pertinent       negatives include normal sphincter tone.      The terminal ileum appeared normal. Estimated blood loss: none.      Retroflexion in the right colon was performed.      Non-bleeding internal hemorrhoids were found during retroflexion. The       hemorrhoids were Grade I (internal hemorrhoids that do not prolapse).       Estimated blood loss: none.      Three sessile polyps were found in the sigmoid colon and ascending       colon. The polyps were 1 to 2 mm in size. These polyps were removed with       a jumbo cold forceps. Resection and retrieval were complete. Estimated       blood loss was minimal.      The exam was otherwise without abnormality on direct and retroflexion       views. Impression:            - The examined portion of the ileum was normal.                        - Non-bleeding internal hemorrhoids.                        - Three 1 to 2 mm polyps in the sigmoid colon and in                         the ascending colon, removed with a jumbo cold                         forceps. Resected  and retrieved.                        - The examination was otherwise normal on direct and                         retroflexion views. Recommendation:        - Patient has a contact number available for                         emergencies. The signs and symptoms of potential                         delayed complications were discussed with the patient.                         Return to normal activities tomorrow. Written                         discharge instructions were provided to the patient.                        - Discharge patient to home.                        -  Resume previous diet.                        - Continue present medications.                        - Await pathology results.                        - Repeat colonoscopy for surveillance based on                         pathology results.                        - Return to referring physician as previously                         scheduled.                        - The findings and recommendations were discussed with                         the patient. Procedure Code(s):     --- Professional ---                        [phone], Colonoscopy, flexible; with biopsy, single or                         multiple Diagnosis Code(s):     --- Professional ---                        Z83.71, Family history of colonic polyps                        K64.0, First degree hemorrhoids                        D12.5, Benign neoplasm of sigmoid colon                        D12.2, Benign neoplasm of ascending colon CPT copyright 2022 American Medical Association. All rights reserved. The codes documented in this report are preliminary and upon coder review may  be revised to meet current compliance requirements. Attending Participation:      I personally performed the entire procedure. Elspeth Jungling, DO Elspeth Ozell Jungling DO, DO 10/29/2024 9:50:14 AM This report has been signed electronically. Number of Addenda: 0 Note Initiated On:  10/29/2024 9:01 AM Scope Withdrawal Time: 0 hours 20 minutes 55 seconds  Total Procedure Duration: 0 hours 25 minutes 8 seconds  Estimated Blood Loss:  Estimated blood loss was minimal.      [facility]

## 2024-10-29 NOTE — Anesthesia Postprocedure Evaluation (Signed)
 Anesthesia Post Note  Patient: Natalie Fisher  Procedure(s) Performed: COLONOSCOPY  Patient location during evaluation: Endoscopy Anesthesia Type: General Level of consciousness: awake and alert Pain management: pain level controlled Vital Signs Assessment: post-procedure vital signs reviewed and stable Respiratory status: spontaneous breathing, nonlabored ventilation and respiratory function stable Cardiovascular status: blood pressure returned to baseline and stable Postop Assessment: no apparent nausea or vomiting Anesthetic complications: no   No notable events documented.   Last Vitals:  Vitals:   10/29/24 0954 10/29/24 1004  BP: 106/68 108/64  Pulse:    Resp: 18 20  Temp:    SpO2: 100% 100%    Last Pain:  Vitals:   10/29/24 1004  TempSrc:   PainSc: 0-No pain                 Fairy POUR Rhona Fusilier

## 2024-10-29 NOTE — H&P (Signed)
 Pre-Procedure H&P   Patient ID: Natalie Fisher is a 48 y.o. female.  Gastroenterology Provider: Elspeth Ozell Jungling, DO  Referring Provider: Dr. Alla PCP: Alla Amis, MD  Date: 10/29/2024  HPI Ms. Natalie Fisher is a 48 y.o. female who presents today for Colonoscopy for Colorectal Cancer screening .  Regular bowel moods without melena hematochezia diarrhea or constipation MGM and mother with polyps; Great MGM c CRC Status post hysterectomy   Past Medical History:  Diagnosis Date   Abnormal bleeding in menstrual cycle    Allergy    Anemia    Breast mass    Left Breast 9 oclock / Right Breast 3oclock   History of bladder infections    Hypertension     Past Surgical History:  Procedure Laterality Date   DILATION AND CURETTAGE OF UTERUS     LAPAROSCOPIC BILATERAL SALPINGECTOMY Bilateral 09/12/2017   Procedure: LAPAROSCOPIC BILATERAL SALPINGECTOMY;  Surgeon: Verdon Keen, MD;  Location: [facility] ORS;  Service: Gynecology;  Laterality: Bilateral;   LAPAROSCOPIC SUPRACERVICAL HYSTERECTOMY N/A 09/12/2017   Procedure: LAPAROSCOPIC SUPRACERVICAL HYSTERECTOMY;  Surgeon: Verdon Keen, MD;  Location: [facility] ORS;  Service: Gynecology;  Laterality: N/A;    Family History MGM and mother with polyps; Great MGM c CRC No other h/o GI disease or malignancy  Review of Systems  Constitutional:  Negative for activity change, appetite change, chills, diaphoresis, fatigue, fever and unexpected weight change.  HENT:  Negative for trouble swallowing and voice change.   Respiratory:  Negative for shortness of breath and wheezing.   Cardiovascular:  Negative for chest pain, palpitations and leg swelling.  Gastrointestinal:  Negative for abdominal distention, abdominal pain, anal bleeding, blood in stool, constipation, diarrhea, nausea, rectal pain and vomiting.  Musculoskeletal:  Negative for arthralgias and myalgias.  Skin:  Negative for color change and pallor.   Neurological:  Negative for dizziness, syncope and weakness.  Psychiatric/Behavioral:  Negative for confusion.   All other systems reviewed and are negative.    Medications No current facility-administered medications on file prior to encounter.   Current Outpatient Medications on File Prior to Encounter  Medication Sig Dispense Refill   lisinopril-hydrochlorothiazide (ZESTORETIC) 20-25 MG tablet Take 1 tablet by mouth daily.     amLODipine (NORVASC) 5 MG tablet Take 5 mg by mouth daily. (Patient not taking: Reported on 10/29/2024)     BIOTIN PO Take by mouth.     Cyanocobalamin (VITAMIN B-12) 500 MCG LOZG Take 500 mcg by mouth daily.     ferrous sulfate 325 (65 FE) MG tablet Take 325 mg by mouth 2 (two) times daily with a meal.     fluticasone  (FLONASE ) 50 MCG/ACT nasal spray Place 1 spray into both nostrils daily for 7 days. 9.9 mL 0   gabapentin  (NEURONTIN ) 800 MG tablet Take 1 tablet (800 mg total) by mouth at bedtime. For up to 7 days after surgery 3 tablet 0   hydrochlorothiazide (HYDRODIURIL) 25 MG tablet Take 25 mg by mouth daily. (Patient not taking: Reported on 10/29/2024)     influenza vac split trivalent PF (FLULAVAL ) 0.5 ML injection Inject into the muscle. 0.5 mL 0   Multiple Vitamins-Minerals (MULTIVITAMIN WITH MINERALS) tablet Take 1 tablet by mouth daily.     spironolactone (ALDACTONE) 25 MG tablet TAKE 1 TABLET BY MOUTH TWICE DAILY. DISCONTINUE HCTZ AND POTASSIUM SUPPLEMENT.     terbinafine (LAMISIL) 250 MG tablet Take 250 mg by mouth daily.      Pertinent medications related to GI and  procedure were reviewed by me with the patient prior to the procedure   Current Facility-Administered Medications:    0.9 %  sodium chloride  infusion, , Intravenous, Continuous, Onita Elspeth Sharper, DO, Last Rate: 20 mL/hr at 10/29/24 9167, Continued from Pre-op at 10/29/24 9167  sodium chloride  20 mL/hr at 10/29/24 9167       Allergies  Allergen Reactions   Amlodipine Swelling    Allergies were reviewed by me prior to the procedure  Objective   Body mass index is 53.75 kg/m. Vitals:   10/29/24 0815  BP: 121/82  Pulse: 87  Resp: 20  Temp: (!) 97.3 F (36.3 C)  TempSrc: Tympanic  SpO2: 100%  Weight: (!) 146.5 kg  Height: 5' 5 (1.651 m)     Physical Exam Vitals and nursing note reviewed.  Constitutional:      General: She is not in acute distress.    Appearance: Normal appearance. She is obese. She is not ill-appearing, toxic-appearing or diaphoretic.  HENT:     Head: Normocephalic and atraumatic.     Nose: Nose normal.     Mouth/Throat:     Mouth: Mucous membranes are moist.     Pharynx: Oropharynx is clear.  Eyes:     General: No scleral icterus.    Extraocular Movements: Extraocular movements intact.  Cardiovascular:     Rate and Rhythm: Normal rate and regular rhythm.     Heart sounds: Normal heart sounds. No murmur heard.    No friction rub. No gallop.  Pulmonary:     Effort: Pulmonary effort is normal. No respiratory distress.     Breath sounds: Normal breath sounds. No wheezing, rhonchi or rales.  Abdominal:     General: Bowel sounds are normal. There is no distension.     Palpations: Abdomen is soft.     Tenderness: There is no abdominal tenderness. There is no guarding or rebound.  Musculoskeletal:     Cervical back: Neck supple.     Right lower leg: No edema.     Left lower leg: No edema.  Skin:    General: Skin is warm and dry.     Coloration: Skin is not jaundiced or pale.  Neurological:     General: No focal deficit present.     Mental Status: She is alert and oriented to person, place, and time. Mental status is at baseline.  Psychiatric:        Mood and Affect: Mood normal.        Behavior: Behavior normal.        Thought Content: Thought content normal.        Judgment: Judgment normal.      Assessment:  Ms. Natalie Fisher is a 48 y.o. female  who presents today for Colonoscopy for Colorectal Cancer  screening .  Plan:  Colonoscopy with possible intervention today  Colonoscopy with possible biopsy, control of bleeding, polypectomy, and interventions as necessary has been discussed with the patient/patient representative. Informed consent was obtained from the patient/patient representative after explaining the indication, nature, and risks of the procedure including but not limited to death, bleeding, perforation, missed neoplasm/lesions, cardiorespiratory compromise, and reaction to medications. Opportunity for questions was given and appropriate answers were provided. Patient/patient representative has verbalized understanding is amenable to undergoing the procedure.   Elspeth Sharper Onita, DO  [facility]  Portions of the record may have been created with voice recognition software. Occasional wrong-word or 'sound-a-like' substitutions may have occurred due to the  inherent limitations of voice recognition software.  Read the chart carefully and recognize, using context, where substitutions may have occurred.

## 2024-10-29 NOTE — Interval H&P Note (Signed)
 History and Physical Interval Note: Preprocedure H&P from 10/29/24  was reviewed and there was no interval change after seeing and examining the patient.  Written consent was obtained from the patient after discussion of risks, benefits, and alternatives. Patient has consented to proceed with Colonoscopy with possible intervention   10/29/2024 9:10 AM  Natalie Fisher  has presented today for surgery, with the diagnosis of Screening for colon cancer (Z12.11) Family hx colonic polyps (Z83.719).  The various methods of treatment have been discussed with the patient and family. After consideration of risks, benefits and other options for treatment, the patient has consented to  Procedure(s): COLONOSCOPY (N/A) as a surgical intervention.  The patient's history has been reviewed, patient examined, no change in status, stable for surgery.  I have reviewed the patient's chart and labs.  Questions were answered to the patient's satisfaction.     Elspeth Ozell Jungling

## 2024-10-29 NOTE — Transfer of Care (Signed)
 Immediate Anesthesia Transfer of Care Note  Patient: Natalie Fisher  Procedure(s) Performed: COLONOSCOPY  Patient Location: Endoscopy Unit  Anesthesia Type:General  Level of Consciousness: drowsy  Airway & Oxygen Therapy: Patient Spontanous Breathing  Post-op Assessment: Report given to RN  Post vital signs: stable  Last Vitals:  Vitals Value Taken Time  BP    Temp    Pulse 84 10/29/24 09:43  Resp 22 10/29/24 09:43  SpO2 100 % 10/29/24 09:43  Vitals shown include unfiled device data.  Last Pain:  Vitals:   10/29/24 0815  TempSrc: Tympanic  PainSc: 0-No pain         Complications: No notable events documented.

## 2024-10-29 NOTE — Anesthesia Preprocedure Evaluation (Signed)
 Anesthesia Evaluation  Patient identified by MRN, date of birth, ID band Patient awake    Reviewed: Allergy & Precautions, NPO status , Patient's Chart, lab work & pertinent test results  History of Anesthesia Complications Negative for: history of anesthetic complications  Airway Mallampati: III  TM Distance: >3 FB Neck ROM: full    Dental  (+) Chipped, Poor Dentition   Pulmonary neg shortness of breath, Current Smoker and Patient abstained from smoking.   Pulmonary exam normal        Cardiovascular Exercise Tolerance: Good hypertension, (-) angina Normal cardiovascular exam     Neuro/Psych negative neurological ROS  negative psych ROS   GI/Hepatic negative GI ROS, Neg liver ROS,neg GERD  ,,  Endo/Other    Class 3 obesity  Renal/GU negative Renal ROS  negative genitourinary   Musculoskeletal   Abdominal   Peds  Hematology negative hematology ROS (+)   Anesthesia Other Findings Past Medical History: No date: Abnormal bleeding in menstrual cycle No date: Allergy No date: Anemia No date: Breast mass     Comment:  Left Breast 9 oclock / Right Breast 3oclock No date: History of bladder infections No date: Hypertension  Past Surgical History: No date: DILATION AND CURETTAGE OF UTERUS 09/12/2017: LAPAROSCOPIC BILATERAL SALPINGECTOMY; Bilateral     Comment:  Procedure: LAPAROSCOPIC BILATERAL SALPINGECTOMY;                Surgeon: Verdon Keen, MD;  Location: [facility];                Service: Gynecology;  Laterality: Bilateral; 09/12/2017: LAPAROSCOPIC SUPRACERVICAL HYSTERECTOMY; N/A     Comment:  Procedure: LAPAROSCOPIC SUPRACERVICAL HYSTERECTOMY;                Surgeon: Verdon Keen, MD;  Location: [facility];                Service: Gynecology;  Laterality: N/A;     Reproductive/Obstetrics negative OB ROS                              Anesthesia Physical Anesthesia  Plan  ASA: 3  Anesthesia Plan: General   Post-op Pain Management:    Induction: Intravenous  PONV Risk Score and Plan: Propofol  infusion and TIVA  Airway Management Planned: Natural Airway and Nasal Cannula  Additional Equipment:   Intra-op Plan:   Post-operative Plan:   Informed Consent: I have reviewed the patients History and Physical, chart, labs and discussed the procedure including the risks, benefits and alternatives for the proposed anesthesia with the patient or authorized representative who has indicated his/her understanding and acceptance.     Dental Advisory Given  Plan Discussed with: Anesthesiologist, CRNA and Surgeon  Anesthesia Plan Comments: (Patient consented for risks of anesthesia including but not limited to:  - adverse reactions to medications - risk of airway placement if required - damage to eyes, teeth, lips or other oral mucosa - nerve damage due to positioning  - sore throat or hoarseness - Damage to heart, brain, nerves, lungs, other parts of body or loss of life  Patient voiced understanding and assent.)        Anesthesia Quick Evaluation

## 2024-10-30 LAB — SURGICAL PATHOLOGY
